# Patient Record
Sex: Male | Born: 1955 | Race: White | Hispanic: No | Marital: Married | Smoking: Never smoker
Health system: Southern US, Community
[De-identification: ages and names within clinical notes are randomized; demographics above are authoritative.]

## PROBLEM LIST (undated history)

## (undated) DIAGNOSIS — Z9889 Other specified postprocedural states: Secondary | ICD-10-CM

## (undated) DIAGNOSIS — M1990 Unspecified osteoarthritis, unspecified site: Secondary | ICD-10-CM

## (undated) DIAGNOSIS — I10 Essential (primary) hypertension: Secondary | ICD-10-CM

## (undated) DIAGNOSIS — D229 Melanocytic nevi, unspecified: Secondary | ICD-10-CM

## (undated) DIAGNOSIS — C4491 Basal cell carcinoma of skin, unspecified: Secondary | ICD-10-CM

## (undated) DIAGNOSIS — C801 Malignant (primary) neoplasm, unspecified: Secondary | ICD-10-CM

## (undated) DIAGNOSIS — R112 Nausea with vomiting, unspecified: Secondary | ICD-10-CM

## (undated) DIAGNOSIS — C439 Malignant melanoma of skin, unspecified: Secondary | ICD-10-CM

## (undated) HISTORY — DX: Melanocytic nevi, unspecified: D22.9

## (undated) HISTORY — DX: Basal cell carcinoma of skin, unspecified: C44.91

## (undated) HISTORY — PX: OTHER SURGICAL HISTORY: SHX169

## (undated) HISTORY — DX: Malignant melanoma of skin, unspecified: C43.9

---

## 2001-01-29 ENCOUNTER — Inpatient Hospital Stay (HOSPITAL_COMMUNITY): Admission: AD | Admit: 2001-01-29 | Discharge: 2001-01-31 | Payer: Self-pay | Admitting: Orthopedic Surgery

## 2007-10-11 DIAGNOSIS — C4491 Basal cell carcinoma of skin, unspecified: Secondary | ICD-10-CM

## 2007-10-11 DIAGNOSIS — D229 Melanocytic nevi, unspecified: Secondary | ICD-10-CM

## 2007-10-11 HISTORY — DX: Melanocytic nevi, unspecified: D22.9

## 2007-10-11 HISTORY — DX: Basal cell carcinoma of skin, unspecified: C44.91

## 2008-03-08 ENCOUNTER — Observation Stay (HOSPITAL_COMMUNITY): Admission: EM | Admit: 2008-03-08 | Discharge: 2008-03-09 | Payer: Self-pay | Admitting: Emergency Medicine
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
## 2011-04-02 LAB — CK TOTAL AND CKMB (NOT AT ARMC)
CK, MB: 1.2
CK, MB: 1.2
CK, MB: 1.4
CK, MB: 1.6
Relative Index: 0.8
Relative Index: 1.5
Relative Index: INVALID
Relative Index: INVALID
Total CK: 109
Total CK: 180
Total CK: 86
Total CK: 86

## 2011-04-02 LAB — CBC
HCT: 44.9
HCT: 46.3
Hemoglobin: 15.4
Hemoglobin: 16.2
MCHC: 34.2
MCHC: 35
MCV: 88.3
MCV: 89.1
Platelets: 193
Platelets: 202
RBC: 5.04
RBC: 5.24
RDW: 12.6
RDW: 12.6
WBC: 5
WBC: 5.7

## 2011-04-02 LAB — LIPID PANEL
Cholesterol: 169
HDL: 31 — ABNORMAL LOW
LDL Cholesterol: 106 — ABNORMAL HIGH
Total CHOL/HDL Ratio: 5.5
Triglycerides: 159 — ABNORMAL HIGH
VLDL: 32

## 2011-04-02 LAB — BASIC METABOLIC PANEL
BUN: 17
CO2: 27
Calcium: 9.5
Chloride: 109
Creatinine, Ser: 1.16
GFR calc Af Amer: >60
GFR calc non Af Amer: >60
Glucose, Bld: 99
Potassium: 4.6
Sodium: 142

## 2011-04-02 LAB — DIFFERENTIAL
Basophils Absolute: 0
Basophils Relative: 0
Eosinophils Absolute: 0
Eosinophils Relative: 1
Lymphocytes Relative: 35
Lymphs Abs: 1.8
Monocytes Absolute: 0.3
Monocytes Relative: 7
Neutro Abs: 2.9
Neutrophils Relative %: 57

## 2011-04-02 LAB — COMPREHENSIVE METABOLIC PANEL
ALT: 20
AST: 21
Albumin: 4
Alkaline Phosphatase: 38 — ABNORMAL LOW
BUN: 15
CO2: 27
Calcium: 9.3
Chloride: 108
Creatinine, Ser: 1.09
GFR calc Af Amer: >60
GFR calc non Af Amer: >60
Glucose, Bld: 107 — ABNORMAL HIGH
Potassium: 3.9
Sodium: 141
Total Bilirubin: 0.8
Total Protein: 6.5

## 2011-04-02 LAB — TROPONIN I
Troponin I: <0.01
Troponin I: <0.01
Troponin I: <0.01

## 2011-04-02 LAB — D-DIMER, QUANTITATIVE: D-Dimer, Quant: <0.22

## 2011-04-16 DIAGNOSIS — C439 Malignant melanoma of skin, unspecified: Secondary | ICD-10-CM

## 2011-04-16 HISTORY — DX: Malignant melanoma of skin, unspecified: C43.9

## 2011-07-01 HISTORY — PX: OTHER SURGICAL HISTORY: SHX169

## 2014-04-13 ENCOUNTER — Other Ambulatory Visit: Payer: Self-pay | Admitting: Orthopedic Surgery

## 2014-05-09 NOTE — H&P (Signed)
TOTAL KNEE ADMISSION H&P  Patient is being admitted for right total knee arthroplasty.  Subjective:  Chief Complaint:    Right knee primary OA / pain.  HPI: Lawrence Compton, 58 y.o. male, has a history of pain and functional disability in the right knee due to arthritis and has failed non-surgical conservative treatments for greater than 12 weeks to includeNSAID's and/or analgesics, corticosteriod injections, viscosupplementation injections, use of assistive devices and activity modification.  Onset of symptoms was gradual, starting  years ago with gradually worsening course since that time. The patient noted prior procedures on the knee to include  arthroscopy and ACL reconstruction on the right knee(s).  Patient currently rates pain in the right knee(s) at 9 out of 10 with activity. Patient has worsening of pain with activity and weight bearing, pain that interferes with activities of daily living, pain with passive range of motion, crepitus and joint swelling.  Patient has evidence of periarticular osteophytes and joint space narrowing by imaging studies.  There is no active infection.  Risks, benefits and expectations were discussed with the patient.  Risks including but not limited to the risk of anesthesia, blood clots, nerve damage, blood vessel damage, failure of the prosthesis, infection and up to and including death.  Patient understand the risks, benefits and expectations and wishes to proceed with surgery.   PCP: Gerrit Heck, MD  D/C Plans:      Home with HHPT  Post-op Meds:       No Rx given  Tranexamic Acid:      To be given - IV    Decadron:      Is to be given  FYI:     ASA post-op  Norco post-op   Past Medical History  Diagnosis Date  . PONV (postoperative nausea and vomiting)   . Hypertension   . Arthritis   . Cancer     melanoma right chest 2013     Past Surgical History  Procedure Laterality Date  . Colonscopy     . Acl implant      right knee       No Known Allergies    History  Substance Use Topics  . Smoking status: Never Smoker   . Smokeless tobacco: Never Used  . Alcohol Use: Yes     Comment: occas          Review of Systems  Constitutional: Negative.   HENT: Negative.   Eyes: Negative.   Respiratory: Negative.   Cardiovascular: Negative.   Gastrointestinal: Negative.   Genitourinary: Negative.   Musculoskeletal: Positive for joint pain.  Skin: Negative.   Neurological: Negative.   Endo/Heme/Allergies: Negative.   Psychiatric/Behavioral: Negative.     Objective:  Physical Exam  Constitutional: He is oriented to person, place, and time. He appears well-developed and well-nourished.  HENT:  Head: Normocephalic and atraumatic.  Eyes: Pupils are equal, round, and reactive to light.  Neck: Neck supple. No JVD present. No tracheal deviation present. No thyromegaly present.  Cardiovascular: Normal rate, regular rhythm, normal heart sounds and intact distal pulses.   Respiratory: Effort normal and breath sounds normal. No stridor. No respiratory distress. He has no wheezes.  GI: Soft. There is no tenderness. There is no guarding.  Musculoskeletal:       Right knee: He exhibits decreased range of motion, swelling, laceration (healed) and bony tenderness. He exhibits no ecchymosis, no deformity, no erythema and normal alignment. Tenderness found.  Lymphadenopathy:    He has no cervical  adenopathy.  Neurological: He is alert and oriented to person, place, and time.  Skin: Skin is warm and dry.  Psychiatric: He has a normal mood and affect.      Imaging Review Plain radiographs demonstrate severe degenerative joint disease of the right knee(s). The overall alignment is neutral. The bone quality appears to be good for age and reported activity level.  Assessment/Plan:  End stage arthritis, right knee   The patient history, physical examination, clinical judgment of the provider and imaging studies are  consistent with end stage degenerative joint disease of the right knee(s) and total knee arthroplasty is deemed medically necessary. The treatment options including medical management, injection therapy arthroscopy and arthroplasty were discussed at length. The risks and benefits of total knee arthroplasty were presented and reviewed. The risks due to aseptic loosening, infection, stiffness, patella tracking problems, thromboembolic complications and other imponderables were discussed. The patient acknowledged the explanation, agreed to proceed with the plan and consent was signed. Patient is being admitted for inpatient treatment for surgery, pain control, PT, OT, prophylactic antibiotics, VTE prophylaxis, progressive ambulation and ADL's and discharge planning. The patient is planning to be discharged home with home health services.     [place] Pugh Sueko Dimichele   PA-C  05/22/2014, 2:31 PM

## 2014-05-22 ENCOUNTER — Ambulatory Visit (HOSPITAL_COMMUNITY)
Admission: RE | Admit: 2014-05-22 | Discharge: 2014-05-22 | Disposition: A | Discharge status: Home / Self Care | Payer: BC Managed Care – PPO | Source: Ambulatory Visit | Attending: Anesthesiology | Admitting: Anesthesiology

## 2014-05-22 ENCOUNTER — Encounter (HOSPITAL_COMMUNITY)
Admission: RE | Admit: 2014-05-22 | Discharge: 2014-05-22 | Disposition: A | Discharge status: Home / Self Care | Payer: BC Managed Care – PPO | Source: Ambulatory Visit | Attending: Orthopedic Surgery | Admitting: Orthopedic Surgery

## 2014-05-22 ENCOUNTER — Encounter (HOSPITAL_COMMUNITY): Payer: Self-pay

## 2014-05-22 DIAGNOSIS — Z01818 Encounter for other preprocedural examination: Secondary | ICD-10-CM | POA: Diagnosis not present

## 2014-05-22 DIAGNOSIS — I10 Essential (primary) hypertension: Secondary | ICD-10-CM

## 2014-05-22 HISTORY — DX: Malignant (primary) neoplasm, unspecified: C80.1

## 2014-05-22 HISTORY — DX: Nausea with vomiting, unspecified: R11.2

## 2014-05-22 HISTORY — DX: Other specified postprocedural states: Z98.890

## 2014-05-22 HISTORY — DX: Essential (primary) hypertension: I10

## 2014-05-22 HISTORY — DX: Unspecified osteoarthritis, unspecified site: M19.90

## 2014-05-22 LAB — BASIC METABOLIC PANEL
Anion gap: 12 (ref 5–15)
BUN: 18 mg/dL (ref 6–23)
CO2: 26 mEq/L (ref 19–32)
Calcium: 10 mg/dL (ref 8.4–10.5)
Chloride: 101 mEq/L (ref 96–112)
Creatinine, Ser: 1.11 mg/dL (ref 0.50–1.35)
GFR calc Af Amer: 83 mL/min — ABNORMAL LOW (ref >90)
GFR calc non Af Amer: 71 mL/min — ABNORMAL LOW (ref >90)
Glucose, Bld: 91 mg/dL (ref 70–99)
Potassium: 4.1 mEq/L (ref 3.7–5.3)
Sodium: 139 mEq/L (ref 137–147)

## 2014-05-22 LAB — CBC
HCT: 44.7 % (ref 39.0–52.0)
Hemoglobin: 15.6 g/dL (ref 13.0–17.0)
MCH: 30.2 pg (ref 26.0–34.0)
MCHC: 34.9 g/dL (ref 30.0–36.0)
MCV: 86.5 fL (ref 78.0–100.0)
Platelets: 202 10*3/uL (ref 150–400)
RBC: 5.17 MIL/uL (ref 4.22–5.81)
RDW: 11.9 % (ref 11.5–15.5)
WBC: 7.4 10*3/uL (ref 4.0–10.5)

## 2014-05-22 LAB — URINALYSIS, ROUTINE W REFLEX MICROSCOPIC
Bilirubin Urine: NEGATIVE
Glucose, UA: NEGATIVE mg/dL
Hgb urine dipstick: NEGATIVE
Ketones, ur: NEGATIVE mg/dL
Leukocytes, UA: NEGATIVE
Nitrite: NEGATIVE
Protein, ur: NEGATIVE mg/dL
Specific Gravity, Urine: 1.014 (ref 1.005–1.030)
Urobilinogen, UA: 0.2 mg/dL (ref 0.0–1.0)
pH: 6 (ref 5.0–8.0)

## 2014-05-22 LAB — ABO/RH: ABO/RH(D): A NEG

## 2014-05-22 LAB — APTT: aPTT: 36 seconds (ref 24–37)

## 2014-05-22 LAB — PROTIME-INR
INR: 1.02 (ref 0.00–1.49)
Prothrombin Time: 13.5 seconds (ref 11.6–15.2)

## 2014-05-22 LAB — SURGICAL PCR SCREEN
MRSA, PCR: NEGATIVE
Staphylococcus aureus: POSITIVE — AB

## 2014-05-22 NOTE — Patient Instructions (Signed)
[place]  05/22/2014   Your procedure is scheduled on:     Monday May 29, 2014   Report to [facility] Main Entrance and follow signs to  [place] arrive at 10:20 AM.  Call this number if you have problems the morning of surgery [phone] or Presurgical Testing [phone].   Remember:  Do not eat food After Midnight but may take clear liquids till 7:20 am day of surgery then nothing by mouth.   For Living Will and/or Health Care Power Attorney Forms: please provide copy for your medical record, may bring AM of surgery (forms should be already notarized-we do not provide this service).   Take these medicines the morning of surgery with A SIP OF WATER: NONE                               You may not have any metal on your body including hair pins and piercings  Do not wear jewelry, lotions, powders, or deodorant.  Men may shave face and neck.               Do not bring valuables to the hospital. [place].  Contacts, dentures or bridgework may not be worn into surgery.  Leave suitcase in the car. After surgery it may be brought to your room.  For patients admitted to the hospital, checkout time is 11:00 AM the day of discharge.     Special Instructions: review fact sheets for MRSA information, Blood Transfusion fact sheet, Incentive Spirometry.  Remember: Type/Screen "Blue armsbands"- may not be removed once applied(would result in being retested AM of surgery, if removed). ________________________________________________________________________  [facility] - Preparing for Surgery Before surgery, you can play an important role.  Because skin is not sterile, your skin needs to be as free of germs as possible.  You can reduce the number of germs on your skin by washing with CHG (chlorahexidine gluconate) soap before surgery.  CHG is an antiseptic cleaner which kills germs and bonds with the skin to continue killing germs  even after washing. Please DO NOT use if you have an allergy to CHG or antibacterial soaps.  If your skin becomes reddened/irritated stop using the CHG and inform your nurse when you arrive at Short Stay. Do not shave (including legs and underarms) for at least 48 hours prior to the first CHG shower.  You may shave your face/neck. Please follow these instructions carefully:  1.  Shower with CHG Soap the night before surgery and the  morning of Surgery.  2.  If you choose to wash your hair, wash your hair first as usual with your  normal  shampoo.  3.  After you shampoo, rinse your hair and body thoroughly to remove the  shampoo.                           4.  Use CHG as you would any other liquid soap.  You can apply chg directly  to the skin and wash                       Gently with a scrungie or clean washcloth.  5.  Apply the CHG Soap to your body ONLY FROM THE NECK DOWN.   Do not use on face/ open  Wound or open sores. Avoid contact with eyes, ears mouth and genitals (private parts).                       Wash face,  Genitals (private parts) with your normal soap.             6.  Wash thoroughly, paying special attention to the area where your surgery  will be performed.  7.  Thoroughly rinse your body with warm water from the neck down.  8.  DO NOT shower/wash with your normal soap after using and rinsing off  the CHG Soap.                9.  Pat yourself dry with a clean towel.            10.  Wear clean pajamas.            11.  Place clean sheets on your bed the night of your first shower and do not  sleep with pets. Day of Surgery : Do not apply any lotions/deodorants the morning of surgery.  Please wear clean clothes to the hospital/surgery center.  FAILURE TO FOLLOW THESE INSTRUCTIONS MAY RESULT IN THE CANCELLATION OF YOUR SURGERY PATIENT SIGNATURE_________________________________  NURSE  SIGNATURE__________________________________  ________________________________________________________________________    CLEAR LIQUID DIET   Foods Allowed                                                                     Foods Excluded  Coffee and tea, regular and decaf                             liquids that you cannot  Plain Jell-O in any flavor                                             see through such as: Fruit ices (not with fruit pulp)                                     milk, soups, orange juice  Iced Popsicles                                    All solid food Carbonated beverages, regular and diet                                    Cranberry, grape and apple juices Sports drinks like Gatorade Lightly seasoned clear broth or consume(fat free) Sugar, honey syrup  Sample Menu Breakfast                                Lunch  Supper Cranberry juice                    Beef broth                            Chicken broth Jell-O                                     Grape juice                           Apple juice Coffee or tea                        Jell-O                                      Popsicle                                                Coffee or tea                        Coffee or tea  _____________________________________________________________________    Incentive Spirometer  An incentive spirometer is a tool that can help keep your lungs clear and active. This tool measures how well you are filling your lungs with each breath. Taking long deep breaths may help reverse or decrease the chance of developing breathing (pulmonary) problems (especially infection) following:  A long period of time when you are unable to move or be active. BEFORE THE PROCEDURE   If the spirometer includes an indicator to show your best effort, your nurse or respiratory therapist will set it to a desired goal.  If possible, sit up straight or lean  slightly forward. Try not to slouch.  Hold the incentive spirometer in an upright position. INSTRUCTIONS FOR USE   Sit on the edge of your bed if possible, or sit up as far as you can in bed or on a chair.  Hold the incentive spirometer in an upright position.  Breathe out normally.  Place the mouthpiece in your mouth and seal your lips tightly around it.  Breathe in slowly and as deeply as possible, raising the piston or the ball toward the top of the column.  Hold your breath for 3-5 seconds or for as long as possible. Allow the piston or ball to fall to the bottom of the column.  Remove the mouthpiece from your mouth and breathe out normally.  Rest for a few seconds and repeat Steps 1 through 7 at least 10 times every 1-2 hours when you are awake. Take your time and take a few normal breaths between deep breaths.  The spirometer may include an indicator to show your best effort. Use the indicator as a goal to work toward during each repetition.  After each set of 10 deep breaths, practice coughing to be sure your lungs are clear. If you have an incision (the cut made at the time of surgery), support your incision when coughing by placing a pillow or rolled up towels firmly against  it. Once you are able to get out of bed, walk around indoors and cough well. You may stop using the incentive spirometer when instructed by your caregiver.  RISKS AND COMPLICATIONS  Take your time so you do not get dizzy or light-headed.  If you are in pain, you may need to take or ask for pain medication before doing incentive spirometry. It is harder to take a deep breath if you are having pain. AFTER USE  Rest and breathe slowly and easily.  It can be helpful to keep track of a log of your progress. Your caregiver can provide you with a simple table to help with this. If you are using the spirometer at home, follow these instructions: [place] IF:   You are having difficultly using the  spirometer.  You have trouble using the spirometer as often as instructed.  Your pain medication is not giving enough relief while using the spirometer.  You develop fever of 100.5 F (38.1 C) or higher. SEEK IMMEDIATE MEDICAL CARE IF:   You cough up bloody sputum that had not been present before.  You develop fever of 102 F (38.9 C) or greater.  You develop worsening pain at or near the incision site. MAKE SURE YOU:   Understand these instructions.  Will watch your condition.  Will get help right away if you are not doing well or get worse. Document Released: 10/27/2006 Document Revised: 09/08/2011 Document Reviewed: 12/28/2006 ExitCare Patient Information 2014 Exit[employer], [place].   ________________________________________________________________________  WHAT IS A BLOOD TRANSFUSION? Blood Transfusion Information  A transfusion is the replacement of blood or some of its parts. Blood is made up of multiple cells which provide different functions.  Red blood cells carry oxygen and are used for blood loss replacement.  White blood cells fight against infection.  Platelets control bleeding.  Plasma helps clot blood.  Other blood products are available for specialized needs, such as hemophilia or other clotting disorders. BEFORE THE TRANSFUSION  Who gives blood for transfusions?   Healthy volunteers who are fully evaluated to make sure their blood is safe. This is blood bank blood. Transfusion therapy is the safest it has ever been in the practice of medicine. Before blood is taken from a donor, a complete history is taken to make sure that person has no history of diseases nor engages in risky social behavior (examples are intravenous drug use or sexual activity with multiple partners). The donor's travel history is screened to minimize risk of transmitting infections, such as malaria. The donated blood is tested for signs of infectious diseases, such as HIV and hepatitis.  The blood is then tested to be sure it is compatible with you in order to minimize the chance of a transfusion reaction. If you or a relative donates blood, this is often done in anticipation of surgery and is not appropriate for emergency situations. It takes many days to process the donated blood. RISKS AND COMPLICATIONS Although transfusion therapy is very safe and saves many lives, the main dangers of transfusion include:   Getting an infectious disease.  Developing a transfusion reaction. This is an allergic reaction to something in the blood you were given. Every precaution is taken to prevent this. The decision to have a blood transfusion has been considered carefully by your caregiver before blood is given. Blood is not given unless the benefits outweigh the risks. AFTER THE TRANSFUSION  Right after receiving a blood transfusion, you will usually feel much better and more  energetic. This is especially true if your red blood cells have gotten low (anemic). The transfusion raises the level of the red blood cells which carry oxygen, and this usually causes an energy increase.  The nurse administering the transfusion will monitor you carefully for complications. HOME CARE INSTRUCTIONS  No special instructions are needed after a transfusion. You may find your energy is better. Speak with your caregiver about any limitations on activity for underlying diseases you may have. SEEK MEDICAL CARE IF:   Your condition is not improving after your transfusion.  You develop redness or irritation at the intravenous (IV) site. SEEK IMMEDIATE MEDICAL CARE IF:  Any of the following symptoms occur over the next 12 hours:  Shaking chills.  You have a temperature by mouth above 102 F (38.9 C), not controlled by medicine.  Chest, back, or muscle pain.  People around you feel you are not acting correctly or are confused.  Shortness of breath or difficulty breathing.  Dizziness and fainting.  You  get a rash or develop hives.  You have a decrease in urine output.  Your urine turns a dark color or changes to pink, red, or brown. Any of the following symptoms occur over the next 10 days:  You have a temperature by mouth above 102 F (38.9 C), not controlled by medicine.  Shortness of breath.  Weakness after normal activity.  The white part of the eye turns yellow (jaundice).  You have a decrease in the amount of urine or are urinating less often.  Your urine turns a dark color or changes to pink, red, or brown. Document Released: 06/13/2000 Document Revised: 09/08/2011 Document Reviewed: 01/31/2008 [facility] Patient Information 2014 [place], [place].  _______________________________________________________________________

## 2014-05-22 NOTE — Progress Notes (Addendum)
EKG per epic from 03/16/2014  LOV note per chart per Dr Drema Dallas 03/16/2014 Called [facility]lick per Dr Aurea Graff office VM message left to fax clearance note per Dr Drema Dallas to PAT

## 2014-05-23 NOTE — Progress Notes (Signed)
Received clearance note from Dr Leighton Ruff on patient with note with clearance explaining EKG done 03/16/2014.  Placed on chart.

## 2014-05-29 ENCOUNTER — Inpatient Hospital Stay (HOSPITAL_COMMUNITY): Payer: BC Managed Care – PPO | Admitting: Anesthesiology

## 2014-05-29 ENCOUNTER — Encounter (HOSPITAL_COMMUNITY)
Admission: RE | Disposition: A | Discharge status: Home / Self Care | Payer: Self-pay | Source: Ambulatory Visit | Attending: Orthopedic Surgery

## 2014-05-29 ENCOUNTER — Encounter (HOSPITAL_COMMUNITY): Payer: Self-pay

## 2014-05-29 ENCOUNTER — Inpatient Hospital Stay (HOSPITAL_COMMUNITY)
Admission: RE | Admit: 2014-05-29 | Discharge: 2014-05-30 | DRG: 470 | Disposition: A | Discharge status: Home / Self Care | Payer: BC Managed Care – PPO | Source: Ambulatory Visit | Attending: Orthopedic Surgery | Admitting: Orthopedic Surgery

## 2014-05-29 DIAGNOSIS — Z85820 Personal history of malignant melanoma of skin: Secondary | ICD-10-CM

## 2014-05-29 DIAGNOSIS — Z96659 Presence of unspecified artificial knee joint: Secondary | ICD-10-CM

## 2014-05-29 DIAGNOSIS — I10 Essential (primary) hypertension: Secondary | ICD-10-CM | POA: Diagnosis present

## 2014-05-29 DIAGNOSIS — E669 Obesity, unspecified: Secondary | ICD-10-CM | POA: Diagnosis present

## 2014-05-29 DIAGNOSIS — Z6831 Body mass index (BMI) 31.0-31.9, adult: Secondary | ICD-10-CM

## 2014-05-29 DIAGNOSIS — M1711 Unilateral primary osteoarthritis, right knee: Secondary | ICD-10-CM | POA: Diagnosis present

## 2014-05-29 DIAGNOSIS — Z96651 Presence of right artificial knee joint: Secondary | ICD-10-CM

## 2014-05-29 HISTORY — PX: TOTAL KNEE ARTHROPLASTY: SHX125

## 2014-05-29 LAB — TYPE AND SCREEN
ABO/RH(D): A NEG
Antibody Screen: NEGATIVE

## 2014-05-29 SURGERY — ARTHROPLASTY, KNEE, TOTAL
Anesthesia: Spinal | Site: Knee | Laterality: Right

## 2014-05-29 MED ORDER — METOCLOPRAMIDE HCL 5 MG/ML IJ SOLN: 5.0000 mg | Freq: Three times a day (TID) | INTRAMUSCULAR | Status: DC | PRN

## 2014-05-29 MED ORDER — PHENOL 1.4 % MT LIQD
1.0000 | OROMUCOSAL | Status: DC | PRN
  Filled 2014-05-29: qty 177

## 2014-05-29 MED ORDER — PROPOFOL 10 MG/ML IV BOLUS
INTRAVENOUS | Status: AC
  Filled 2014-05-29: qty 20

## 2014-05-29 MED ORDER — ASPIRIN EC 325 MG PO TBEC
325.0000 mg | DELAYED_RELEASE_TABLET | Freq: Two times a day (BID) | ORAL | Status: DC
  Administered 2014-05-30: 325 mg via ORAL
  Filled 2014-05-29 (×3): qty 1

## 2014-05-29 MED ORDER — CEFAZOLIN SODIUM-DEXTROSE 2-3 GM-% IV SOLR
INTRAVENOUS | Status: AC
  Filled 2014-05-29: qty 50

## 2014-05-29 MED ORDER — CEFAZOLIN SODIUM-DEXTROSE 2-3 GM-% IV SOLR
2.0000 g | INTRAVENOUS | Status: AC
  Administered 2014-05-29: 2 g via INTRAVENOUS

## 2014-05-29 MED ORDER — MIDAZOLAM HCL 5 MG/5ML IJ SOLN
INTRAMUSCULAR | Status: DC | PRN
Start: 2014-05-29 — End: 2014-05-29
  Administered 2014-05-29: 2 mg via INTRAVENOUS

## 2014-05-29 MED ORDER — ONDANSETRON HCL 4 MG PO TABS: 4.0000 mg | ORAL_TABLET | Freq: Four times a day (QID) | ORAL | Status: DC | PRN

## 2014-05-29 MED ORDER — MENTHOL 3 MG MT LOZG
1.0000 | LOZENGE | OROMUCOSAL | Status: DC | PRN
  Filled 2014-05-29: qty 9

## 2014-05-29 MED ORDER — SODIUM CHLORIDE 0.9 % IJ SOLN
INTRAMUSCULAR | Status: AC
  Filled 2014-05-29: qty 10

## 2014-05-29 MED ORDER — SODIUM CHLORIDE 0.9 % IV SOLN
INTRAVENOUS | Status: DC
  Administered 2014-05-29: 18:00:00 via INTRAVENOUS
  Filled 2014-05-29 (×3): qty 1000

## 2014-05-29 MED ORDER — FENTANYL CITRATE 0.05 MG/ML IJ SOLN
INTRAMUSCULAR | Status: AC
  Filled 2014-05-29: qty 2

## 2014-05-29 MED ORDER — PROPOFOL INFUSION 10 MG/ML OPTIME
INTRAVENOUS | Status: DC | PRN
  Administered 2014-05-29: 100 ug/kg/min via INTRAVENOUS

## 2014-05-29 MED ORDER — DEXAMETHASONE SODIUM PHOSPHATE 10 MG/ML IJ SOLN
INTRAMUSCULAR | Status: AC
Start: 2014-05-29 — End: 2014-05-29
  Filled 2014-05-29: qty 1

## 2014-05-29 MED ORDER — DEXAMETHASONE SODIUM PHOSPHATE 10 MG/ML IJ SOLN
10.0000 mg | Freq: Once | INTRAMUSCULAR | Status: AC
  Administered 2014-05-29: 10 mg via INTRAVENOUS

## 2014-05-29 MED ORDER — CEFAZOLIN SODIUM-DEXTROSE 2-3 GM-% IV SOLR
2.0000 g | Freq: Four times a day (QID) | INTRAVENOUS | Status: AC
  Administered 2014-05-29 (×2): 2 g via INTRAVENOUS
  Filled 2014-05-29 (×2): qty 50

## 2014-05-29 MED ORDER — TRANEXAMIC ACID 100 MG/ML IV SOLN
1000.0000 mg | Freq: Once | INTRAVENOUS | Status: AC
  Administered 2014-05-29: 1000 mg via INTRAVENOUS
  Filled 2014-05-29: qty 10

## 2014-05-29 MED ORDER — DEXTROSE 5 % IV SOLN
500.0000 mg | Freq: Four times a day (QID) | INTRAVENOUS | Status: DC | PRN
  Administered 2014-05-29 (×2): 500 mg via INTRAVENOUS
  Filled 2014-05-29 (×4): qty 5

## 2014-05-29 MED ORDER — KETOROLAC TROMETHAMINE 30 MG/ML IJ SOLN
INTRAMUSCULAR | Status: DC | PRN
  Administered 2014-05-29: 30 mg via INTRAMUSCULAR

## 2014-05-29 MED ORDER — HYDROMORPHONE HCL 1 MG/ML IJ SOLN: 0.2500 mg | INTRAMUSCULAR | Status: DC | PRN

## 2014-05-29 MED ORDER — ONDANSETRON HCL 4 MG/2ML IJ SOLN
INTRAMUSCULAR | Status: DC | PRN
  Administered 2014-05-29: 4 mg via INTRAVENOUS

## 2014-05-29 MED ORDER — POVIDONE-IODINE 7.5 % EX SOLN: Freq: Once | CUTANEOUS | Status: DC

## 2014-05-29 MED ORDER — MAGNESIUM CITRATE PO SOLN: 1.0000 | Freq: Once | ORAL | Status: AC | PRN

## 2014-05-29 MED ORDER — FERROUS SULFATE 325 (65 FE) MG PO TABS
325.0000 mg | ORAL_TABLET | Freq: Three times a day (TID) | ORAL | Status: DC
  Administered 2014-05-30: 325 mg via ORAL
  Filled 2014-05-29 (×5): qty 1

## 2014-05-29 MED ORDER — MIDAZOLAM HCL 2 MG/2ML IJ SOLN
INTRAMUSCULAR | Status: AC
  Filled 2014-05-29: qty 2

## 2014-05-29 MED ORDER — LACTATED RINGERS IV SOLN
INTRAVENOUS | Status: DC
  Administered 2014-05-29: 1000 mL via INTRAVENOUS
  Administered 2014-05-29 (×2): via INTRAVENOUS

## 2014-05-29 MED ORDER — BUPIVACAINE HCL (PF) 0.75 % IJ SOLN
INTRAMUSCULAR | Status: DC | PRN
  Administered 2014-05-29: 2 mL

## 2014-05-29 MED ORDER — BUPIVACAINE-EPINEPHRINE (PF) 0.25% -1:200000 IJ SOLN
INTRAMUSCULAR | Status: AC
  Filled 2014-05-29: qty 30

## 2014-05-29 MED ORDER — ONDANSETRON HCL 4 MG/2ML IJ SOLN
INTRAMUSCULAR | Status: AC
  Filled 2014-05-29: qty 2

## 2014-05-29 MED ORDER — EPHEDRINE SULFATE 50 MG/ML IJ SOLN
INTRAMUSCULAR | Status: AC
  Filled 2014-05-29: qty 1

## 2014-05-29 MED ORDER — DOCUSATE SODIUM 100 MG PO CAPS
100.0000 mg | ORAL_CAPSULE | Freq: Two times a day (BID) | ORAL | Status: DC
  Administered 2014-05-29 – 2014-05-30 (×2): 100 mg via ORAL

## 2014-05-29 MED ORDER — BUPIVACAINE-EPINEPHRINE (PF) 0.25% -1:200000 IJ SOLN
INTRAMUSCULAR | Status: DC | PRN
  Administered 2014-05-29: 30 mL

## 2014-05-29 MED ORDER — HYDROMORPHONE HCL 1 MG/ML IJ SOLN
0.5000 mg | INTRAMUSCULAR | Status: DC | PRN
  Administered 2014-05-29 (×3): 1 mg via INTRAVENOUS
  Filled 2014-05-29 (×4): qty 1

## 2014-05-29 MED ORDER — 0.9 % SODIUM CHLORIDE (POUR BTL) OPTIME
TOPICAL | Status: DC | PRN
  Administered 2014-05-29: 1000 mL

## 2014-05-29 MED ORDER — FENTANYL CITRATE 0.05 MG/ML IJ SOLN
INTRAMUSCULAR | Status: DC | PRN
  Administered 2014-05-29: 100 ug via INTRAVENOUS

## 2014-05-29 MED ORDER — POLYETHYLENE GLYCOL 3350 17 G PO PACK
17.0000 g | PACK | Freq: Two times a day (BID) | ORAL | Status: DC
  Administered 2014-05-29 – 2014-05-30 (×2): 17 g via ORAL

## 2014-05-29 MED ORDER — ALUM & MAG HYDROXIDE-SIMETH 200-200-20 MG/5ML PO SUSP: 30.0000 mL | ORAL | Status: DC | PRN

## 2014-05-29 MED ORDER — CELECOXIB 200 MG PO CAPS
200.0000 mg | ORAL_CAPSULE | Freq: Two times a day (BID) | ORAL | Status: DC
  Administered 2014-05-29 – 2014-05-30 (×2): 200 mg via ORAL
  Filled 2014-05-29 (×3): qty 1

## 2014-05-29 MED ORDER — BISACODYL 10 MG RE SUPP
10.0000 mg | Freq: Every day | RECTAL | Status: DC | PRN
Start: 2014-05-29 — End: 2014-05-30

## 2014-05-29 MED ORDER — ONDANSETRON HCL 4 MG/2ML IJ SOLN: 4.0000 mg | Freq: Four times a day (QID) | INTRAMUSCULAR | Status: DC | PRN

## 2014-05-29 MED ORDER — SODIUM CHLORIDE 0.9 % IR SOLN
Status: DC | PRN
  Administered 2014-05-29: 3000 mL

## 2014-05-29 MED ORDER — METHOCARBAMOL 500 MG PO TABS
500.0000 mg | ORAL_TABLET | Freq: Four times a day (QID) | ORAL | Status: DC | PRN
Start: 2014-05-29 — End: 2014-05-30
  Administered 2014-05-30 (×2): 500 mg via ORAL
  Filled 2014-05-29 (×2): qty 1

## 2014-05-29 MED ORDER — CEFAZOLIN SODIUM-DEXTROSE 2-3 GM-% IV SOLR: 2.0000 g | INTRAVENOUS | Status: DC

## 2014-05-29 MED ORDER — SODIUM CHLORIDE 0.9 % IJ SOLN
INTRAMUSCULAR | Status: AC
  Filled 2014-05-29: qty 50

## 2014-05-29 MED ORDER — CHLORHEXIDINE GLUCONATE 4 % EX LIQD: 60.0000 mL | Freq: Once | CUTANEOUS | Status: DC

## 2014-05-29 MED ORDER — METOCLOPRAMIDE HCL 10 MG PO TABS: 5.0000 mg | ORAL_TABLET | Freq: Three times a day (TID) | ORAL | Status: DC | PRN

## 2014-05-29 MED ORDER — SODIUM CHLORIDE 0.9 % IJ SOLN
INTRAMUSCULAR | Status: DC | PRN
  Administered 2014-05-29: 30 mL

## 2014-05-29 MED ORDER — LACTATED RINGERS IV SOLN: INTRAVENOUS | Status: DC

## 2014-05-29 MED ORDER — DIPHENHYDRAMINE HCL 25 MG PO CAPS: 25.0000 mg | ORAL_CAPSULE | Freq: Four times a day (QID) | ORAL | Status: DC | PRN

## 2014-05-29 MED ORDER — KETOROLAC TROMETHAMINE 30 MG/ML IJ SOLN
INTRAMUSCULAR | Status: AC
  Filled 2014-05-29: qty 1

## 2014-05-29 MED ORDER — HYDROCODONE-ACETAMINOPHEN 7.5-325 MG PO TABS
1.0000 | ORAL_TABLET | ORAL | Status: DC
  Administered 2014-05-29 – 2014-05-30 (×6): 2 via ORAL
  Filled 2014-05-29 (×6): qty 2

## 2014-05-29 MED ORDER — DEXAMETHASONE SODIUM PHOSPHATE 10 MG/ML IJ SOLN
10.0000 mg | Freq: Once | INTRAMUSCULAR | Status: AC
  Administered 2014-05-30: 10 mg via INTRAVENOUS
  Filled 2014-05-29: qty 1

## 2014-05-29 SURGICAL SUPPLY — 56 items
ADH SKN CLS APL DERMABOND .7 (GAUZE/BANDAGES/DRESSINGS) ×1
BAG SPEC THK2 15X12 ZIP CLS (MISCELLANEOUS)
BAG ZIPLOCK 12X15 (MISCELLANEOUS) IMPLANT
BANDAGE ELASTIC 6 VELCRO ST LF (GAUZE/BANDAGES/DRESSINGS) ×2 IMPLANT
BANDAGE ESMARK 6X9 LF (GAUZE/BANDAGES/DRESSINGS) ×1 IMPLANT
BLADE SAW SGTL 13.0X1.19X90.0M (BLADE) ×2 IMPLANT
BNDG CMPR 9X6 STRL LF SNTH (GAUZE/BANDAGES/DRESSINGS) ×1
BNDG ESMARK 6X9 LF (GAUZE/BANDAGES/DRESSINGS) ×2
BOWL SMART MIX CTS (DISPOSABLE) ×2 IMPLANT
CAP KNEE ATTUNE RP ×1 IMPLANT
CEMENT HV SMART SET (Cement) ×2 IMPLANT
CUFF TOURN SGL QUICK 34 (TOURNIQUET CUFF) ×2
CUFF TRNQT CYL 34X4X40X1 (TOURNIQUET CUFF) ×1 IMPLANT
DECANTER SPIKE VIAL GLASS SM (MISCELLANEOUS) ×2 IMPLANT
DERMABOND ADVANCED (GAUZE/BANDAGES/DRESSINGS) ×1
DERMABOND ADVANCED .7 DNX12 (GAUZE/BANDAGES/DRESSINGS) IMPLANT
DRAPE EXTREMITY T 121X128X90 (DRAPE) ×2 IMPLANT
DRAPE POUCH INSTRU U-SHP 10X18 (DRAPES) ×2 IMPLANT
DRAPE U-SHAPE 47X51 STRL (DRAPES) ×2 IMPLANT
DRSG AQUACEL AG ADV 3.5X10 (GAUZE/BANDAGES/DRESSINGS) ×2 IMPLANT
DURAPREP 26ML APPLICATOR (WOUND CARE) ×4 IMPLANT
ELECT REM PT RETURN 9FT ADLT (ELECTROSURGICAL) ×2
ELECTRODE REM PT RTRN 9FT ADLT (ELECTROSURGICAL) ×1 IMPLANT
FACESHIELD WRAPAROUND (MASK) ×8 IMPLANT
FACESHIELD WRAPAROUND OR TEAM (MASK) ×5 IMPLANT
GLOVE BIOGEL PI IND STRL 7.5 (GLOVE) ×1 IMPLANT
GLOVE BIOGEL PI IND STRL 8.5 (GLOVE) ×1 IMPLANT
GLOVE BIOGEL PI INDICATOR 7.5 (GLOVE) ×1
GLOVE BIOGEL PI INDICATOR 8.5 (GLOVE) ×1
GLOVE ECLIPSE 8.0 STRL XLNG CF (GLOVE) ×2 IMPLANT
GLOVE ORTHO TXT STRL SZ7.5 (GLOVE) ×4 IMPLANT
GOWN SPEC L3 XXLG W/TWL (GOWN DISPOSABLE) ×2 IMPLANT
GOWN STRL REUS W/TWL LRG LVL3 (GOWN DISPOSABLE) ×2 IMPLANT
HANDPIECE INTERPULSE COAX TIP (DISPOSABLE) ×2
KIT BASIN OR (CUSTOM PROCEDURE TRAY) ×2 IMPLANT
LIQUID BAND (GAUZE/BANDAGES/DRESSINGS) ×2 IMPLANT
MANIFOLD NEPTUNE II (INSTRUMENTS) ×2 IMPLANT
NDL SAFETY ECLIPSE 18X1.5 (NEEDLE) ×1 IMPLANT
NEEDLE HYPO 18GX1.5 SHARP (NEEDLE) ×2
PACK TOTAL JOINT (CUSTOM PROCEDURE TRAY) ×2 IMPLANT
POSITIONER SURGICAL ARM (MISCELLANEOUS) ×2 IMPLANT
SET HNDPC FAN SPRY TIP SCT (DISPOSABLE) ×1 IMPLANT
SET PAD KNEE POSITIONER (MISCELLANEOUS) ×2 IMPLANT
SUCTION FRAZIER 12FR DISP (SUCTIONS) ×2 IMPLANT
SUT MNCRL AB 4-0 PS2 18 (SUTURE) ×2 IMPLANT
SUT VIC AB 1 CT1 36 (SUTURE) ×2 IMPLANT
SUT VIC AB 2-0 CT1 27 (SUTURE) ×6
SUT VIC AB 2-0 CT1 TAPERPNT 27 (SUTURE) ×3 IMPLANT
SUT VLOC 180 0 24IN GS25 (SUTURE) ×2 IMPLANT
SYR 50ML LL SCALE MARK (SYRINGE) ×2 IMPLANT
TOWEL OR 17X26 10 PK STRL BLUE (TOWEL DISPOSABLE) ×2 IMPLANT
TOWEL OR NON WOVEN STRL DISP B (DISPOSABLE) ×1 IMPLANT
TRAY FOLEY BAG SILVER LF 16FR (CATHETERS) ×1 IMPLANT
TRAY FOLEY CATH 14FRSI W/METER (CATHETERS) ×1 IMPLANT
WATER STERILE IRR 1500ML POUR (IV SOLUTION) ×2 IMPLANT
WRAP KNEE MAXI GEL POST OP (GAUZE/BANDAGES/DRESSINGS) ×2 IMPLANT

## 2014-05-29 NOTE — Transfer of Care (Signed)
Immediate Anesthesia Transfer of Care Note  Patient: Lawrence Compton  Procedure(s) Performed: Procedure(s): RIGHT TOTAL KNEE ARTHROPLASTY WITH REMOVAL OF HARDWARE (Right)  Patient Location: PACU  Anesthesia Type:Spinal  Level of Consciousness: awake and alert   Airway & Oxygen Therapy: Patient Spontanous Breathing and Patient connected to face mask oxygen  Post-op Assessment: Report given to PACU RN, Post -op Vital signs reviewed and stable and Patient moving all extremities  Post vital signs: Reviewed and stable  Complications: No apparent anesthesia complications

## 2014-05-29 NOTE — Plan of Care (Signed)
Problem: Phase I Progression Outcomes Goal: CMS/Neurovascular status WDL Outcome: Completed/Met Date Met:  05/29/14

## 2014-05-29 NOTE — Anesthesia Procedure Notes (Signed)
Spinal Patient location during procedure: OR Start time: 05/29/2014 1:03 PM End time: 05/29/2014 1:05 PM Staffing Resident/CRNA: Harle Stanford R Performed by: resident/CRNA  Preanesthetic Checklist Completed: patient identified, site marked, surgical consent, pre-op evaluation, timeout performed, IV checked, risks and benefits discussed and monitors and equipment checked Spinal Block Patient position: sitting Prep: Betadine Patient monitoring: heart rate Approach: midline Location: L3-4 Injection technique: single-shot Needle Needle type: Whitacre  Needle gauge: 25 G Needle length: 9 cm Needle insertion depth: 8 cm Assessment Sensory level: T6

## 2014-05-29 NOTE — Interval H&P Note (Signed)
History and Physical Interval Note:  05/29/2014 12:07 PM  Lawrence Compton  has presented today for surgery, with the diagnosis of right knee osteoarthritis and retained hardware  The various methods of treatment have been discussed with the patient and family. After consideration of risks, benefits and other options for treatment, the patient has consented to  Procedure(s): RIGHT TOTAL KNEE ARTHROPLASTY WITH POSSIBLE REMOVAL OF HARDWARE (Right) as a surgical intervention .  The patient's history has been reviewed, patient examined, no change in status, stable for surgery.  I have reviewed the patient's chart and labs.  Questions were answered to the patient's satisfaction.     Mauri Pole

## 2014-05-29 NOTE — Op Note (Signed)
NAME:  Lawrence Compton                      MEDICAL RECORD NO.:  [id number]                             FACILITY:  [facility]      PHYSICIAN:  Pietro Cassis. Alvan Dame, M.D.  DATE OF BIRTH:  1956/05/04      DATE OF PROCEDURE:  05/29/2014                                     OPERATIVE REPORT         PREOPERATIVE DIAGNOSIS:  Right knee osteoarthritis.      POSTOPERATIVE DIAGNOSIS:  Right knee osteoarthritis.  Retained Orthopaedic hardware     FINDINGS:  The patient was noted to have complete loss of cartilage and   bone-on-bone arthritis with associated osteophytes in all three compartments of   the knee with a significant synovitis and associated effusion.      PROCEDURE: 1.  Right total knee replacement. 2. Removal of deep implant     COMPONENTS USED:  DePuy Attune rotating platform posterior stabilized knee   system, a size 6 standard femur, 7 tibia, 10 mm AOX PS insert, and 53mm AOX anatomic patellar   button.      SURGEON:  Pietro Cassis. Alvan Dame, M.D.      ASSISTANT:  Danae Orleans, PA-C.      ANESTHESIA:  Spinal.      SPECIMENS:  None.      COMPLICATION:  None.      DRAINS:  None.  EBL: <100cc      TOURNIQUET TIME:   Total Tourniquet Time Documented: Thigh (Right) - 45 minutes Total: Thigh (Right) - 45 minutes  .      The patient was stable to the recovery room.      INDICATION FOR PROCEDURE:  Lawrence Compton is a 58 y.o. male patient of   mine.  The patient had been seen, evaluated, and treated conservatively in the   office with medication, activity modification, and injections.  The patient had   radiographic changes of bone-on-bone arthritis with endplate sclerosis and osteophytes noted.  He has a history of previous ACL reconstruction with retained metal implants.  It was discussed that one or both of the screws may need to be removed at the time of his TKR.      The patient failed conservative measures including medication, injections, and activity modification, and at this  point was ready for more definitive measures.   Based on the radiographic changes and failed conservative measures, the patient   decided to proceed with total knee replacement.  Risks of infection,   DVT, component failure, need for revision surgery, postop course, and   expectations were all   discussed and reviewed.  Consent was obtained for benefit of pain   relief.      PROCEDURE IN DETAIL:  The patient was brought to the operative theater.   Once adequate anesthesia, preoperative antibiotics, 2 gm of Ancef administered, the patient was positioned supine with the right thigh tourniquet placed.  The  right lower extremity was prepped and draped in sterile fashion.  A time-   out was performed identifying the patient, planned procedure, and   extremity.      The  right lower extremity was placed in the [employer] leg holder.  The leg was   exsanguinated, tourniquet elevated to 250 mmHg.  A midline incision was   made followed by median parapatellar arthrotomy.  Following initial   exposure, attention was first directed to the patella.  Precut   measurement was noted to be 26 mm.  I resected down to 14-15 mm and used a   41 anatomic patellar button to restore patellar height as well as cover the cut   surface.      The lug holes were drilled and a metal shim was placed to protect the   patella from retractors and saw blades.      At this point, attention was now directed to the femur.  The femoral   canal was opened with a drill, irrigated to try to prevent fat emboli.  An   intramedullary rod was passed at 5 degrees valgus, 9 mm of bone was   resected off the distal femur.  Following this resection, the tibia was   subluxated anteriorly.  Using the extramedullary guide, 4 mm of bone was resected off   the proximal lateral tibia.  We confirmed the gap would be   stable medially and laterally with a size 85mm insert as well as confirmed   the cut was perpendicular in the coronal plane,  checking with an alignment rod.      Once this was done, I sized the femur to be a size 6 in the anterior-   posterior dimension, chose a standard component based on medial and   lateral dimension.  The size 6 rotation block was then pinned in   position anterior referenced using the tensioning device to set rotation and match extension and flexion gaps.  The   anterior, posterior, and  chamfer cuts were made without difficulty nor   notching making certain that I was along the anterior cortex to help   with flexion gap stability.      The final box cut was made off the lateral aspect of distal femur.      At this point, the tibia was sized to be a size 7, the size 7 tray was   then pinned in position through the medial third of the tubercle  As expected during preparation of the tibia we encountered the tibial based ACL screw.  It needed to be removed.  Using an osteotome I created a small opening in the proximal anterior medial tibial to expose the distal end of the screw.  The screw once identified was removed without complication.  Once removed I was bale to complete tibial preparation by   drilling, and then keel punching the tibia.  Trial reduction was now carried with a 6 femur,  7 tibia, a 8 then 10 mm insert, and the 41 anatomic patella botton.  The knee was brought to   extension, full extension with good flexion stability with the patella   tracking through the trochlea without application of pressure.  Given   all these findings, the trial components removed.  Final components were   opened and cement was mixed.  The knee was irrigated with normal saline   solution and pulse lavage.  The synovial lining was   then injected with 30cc of 0.25% Marcaine with epinephrine and 1 cc of Toradol plus 30cc of NS for a    total of 61 cc.      The knee was irrigated.  Final implants  were then cemented onto clean and   dried cut surfaces of bone with the knee brought to extension with a 10 mm  trial insert.      Once the cement had fully cured, the excess cement was removed   throughout the knee.  I confirmed I was satisfied with the range of   motion and stability, and the final 10 mm PS AOX insert was chosen.  It was   placed into the knee.      The tourniquet had been let down at 45 minutes.  No significant   hemostasis required.  The   extensor mechanism was then reapproximated using #1 Vicryl and #0 V-lock sutures with the knee   in flexion.  The   remaining wound was closed with 2-0 Vicryl and running 4-0 Monocryl.   The knee was cleaned, dried, dressed sterilely using Dermabond and   Aquacel dressing.  The patient was then   brought to recovery room in stable condition, tolerating the procedure   well.   Please note that Physician Assistant, Danae Orleans, PA-C, was present for the entirety of the case, and was utilized for pre-operative positioning, peri-operative retractor management, general facilitation of the procedure.  He was also utilized for primary wound closure at the end of the case.              Pietro Cassis Alvan Dame, M.D.    05/29/2014 2:44 PM

## 2014-05-29 NOTE — Anesthesia Postprocedure Evaluation (Signed)
  Anesthesia Post-op Note  Patient: Chiropractor  Procedure(s) Performed: Procedure(s) (LRB): RIGHT TOTAL KNEE ARTHROPLASTY WITH REMOVAL OF HARDWARE (Right)  Patient Location: PACU  Anesthesia Type: Spinal  Level of Consciousness: awake and alert   Airway and Oxygen Therapy: Patient Spontanous Breathing  Post-op Pain: mild  Post-op Assessment: Post-op Vital signs reviewed, Patient's Cardiovascular Status Stable, Respiratory Function Stable, Patent Airway and No signs of Nausea or vomiting  Last Vitals:  Filed Vitals:   05/29/14 1749  BP: 128/75  Pulse: 53  Temp: 36.4 C  Resp: 15    Post-op Vital Signs: stable   Complications: No apparent anesthesia complications

## 2014-05-29 NOTE — Anesthesia Preprocedure Evaluation (Addendum)
Anesthesia Evaluation  Patient identified by MRN, date of birth, ID band Patient awake    Reviewed: Allergy & Precautions, H&P , NPO status , Patient's Chart, lab work & pertinent test results  History of Anesthesia Complications (+) PONV  Airway Mallampati: II  TM Distance: >3 FB Neck ROM: full    Dental no notable dental hx. (+) Teeth Intact, Dental Advisory Given   Pulmonary neg pulmonary ROS,  breath sounds clear to auscultation  Pulmonary exam normal       Cardiovascular Exercise Tolerance: Good hypertension, Pt. on medications Rhythm:regular Rate:Normal     Neuro/Psych negative neurological ROS  negative psych ROS   GI/Hepatic negative GI ROS, Neg liver ROS,   Endo/Other  negative endocrine ROS  Renal/GU negative Renal ROS  negative genitourinary   Musculoskeletal   Abdominal   Peds  Hematology negative hematology ROS (+)   Anesthesia Other Findings   Reproductive/Obstetrics negative OB ROS                            Anesthesia Physical Anesthesia Plan  ASA: II  Anesthesia Plan: Spinal   Post-op Pain Management:    Induction:   Airway Management Planned: Simple Face Mask  Additional Equipment:   Intra-op Plan:   Post-operative Plan:   Informed Consent: I have reviewed the patients History and Physical, chart, labs and discussed the procedure including the risks, benefits and alternatives for the proposed anesthesia with the patient or authorized representative who has indicated his/her understanding and acceptance.   Dental Advisory Given  Plan Discussed with: CRNA and Surgeon  Anesthesia Plan Comments:         Anesthesia Quick Evaluation

## 2014-05-29 NOTE — Plan of Care (Signed)
Problem: Phase I Progression Outcomes Goal: Hemodynamically stable Outcome: Completed/Met Date Met:  05/29/14

## 2014-05-30 ENCOUNTER — Encounter (HOSPITAL_COMMUNITY): Payer: Self-pay | Admitting: Orthopedic Surgery

## 2014-05-30 DIAGNOSIS — E669 Obesity, unspecified: Secondary | ICD-10-CM | POA: Diagnosis present

## 2014-05-30 LAB — CBC
HCT: 37.5 % — ABNORMAL LOW (ref 39.0–52.0)
Hemoglobin: 13.5 g/dL (ref 13.0–17.0)
MCH: 30.8 pg (ref 26.0–34.0)
MCHC: 36 g/dL (ref 30.0–36.0)
MCV: 85.6 fL (ref 78.0–100.0)
Platelets: 203 10*3/uL (ref 150–400)
RBC: 4.38 MIL/uL (ref 4.22–5.81)
RDW: 12 % (ref 11.5–15.5)
WBC: 14.2 10*3/uL — ABNORMAL HIGH (ref 4.0–10.5)

## 2014-05-30 LAB — BASIC METABOLIC PANEL
Anion gap: 12 (ref 5–15)
BUN: 14 mg/dL (ref 6–23)
CO2: 25 mEq/L (ref 19–32)
Calcium: 8.8 mg/dL (ref 8.4–10.5)
Chloride: 104 mEq/L (ref 96–112)
Creatinine, Ser: 1.03 mg/dL (ref 0.50–1.35)
GFR calc Af Amer: >90 mL/min (ref >90)
GFR calc non Af Amer: 78 mL/min — ABNORMAL LOW (ref >90)
Glucose, Bld: 132 mg/dL — ABNORMAL HIGH (ref 70–99)
Potassium: 4.5 mEq/L (ref 3.7–5.3)
Sodium: 141 mEq/L (ref 137–147)

## 2014-05-30 MED ORDER — POLYETHYLENE GLYCOL 3350 17 G PO PACK: 17.0000 g | PACK | Freq: Two times a day (BID) | ORAL | Status: DC

## 2014-05-30 MED ORDER — DSS 100 MG PO CAPS: 100.0000 mg | ORAL_CAPSULE | Freq: Two times a day (BID) | ORAL | Status: DC

## 2014-05-30 MED ORDER — METHOCARBAMOL 500 MG PO TABS: 500.0000 mg | ORAL_TABLET | Freq: Four times a day (QID) | ORAL | Status: DC | PRN

## 2014-05-30 MED ORDER — FERROUS SULFATE 325 (65 FE) MG PO TABS: 325.0000 mg | ORAL_TABLET | Freq: Three times a day (TID) | ORAL | Status: DC

## 2014-05-30 MED ORDER — ASPIRIN 325 MG PO TBEC: 325.0000 mg | DELAYED_RELEASE_TABLET | Freq: Two times a day (BID) | ORAL | Status: AC

## 2014-05-30 MED ORDER — HYDROCODONE-ACETAMINOPHEN 7.5-325 MG PO TABS: 1.0000 | ORAL_TABLET | ORAL | Status: DC | PRN

## 2014-05-30 NOTE — Care Management Note (Addendum)
    Page 1 of 2   05/30/2014     12:09:17 PM CARE MANAGEMENT NOTE 05/30/2014  Patient:  Lawrence Compton, Lawrence Compton   Account Number:  [id number]  Date Initiated:  05/30/2014  Documentation initiated by:  [facility]  Subjective/Objective Assessment:   adm: RIGHT TOTAL KNEE ARTHROPLASTY     Action/Plan:   discharge planning   Anticipated DC Date:  05/30/2014   Anticipated DC Plan:  [place]  CM consult      [facility] Choice  HOME HEALTH   Choice offered to / List presented to:  C-1 Patient   DME arranged  NA      DME agency  NA     [facility] arranged  HH-2 PT      [place]   Status of service:  Completed, signed off Medicare Important Message given?   (If response is "NO", the following Medicare IM given date fields will be blank) Date Medicare IM given:   Medicare IM given by:   Date Additional Medicare IM given:   Additional Medicare IM given by:    Discharge Disposition:  [place]  Per UR Regulation:    If discussed at Long Length of Stay Meetings, dates discussed:    Comments:  05/30/14 12:05 CM met with pt in room AGAIN as pt's wife states pt has changed his mind and NEEDS HHPT.  Pt chooses [facility] to render HHPT.  Address and contact information verified with pt.  Referral called to Shaune Leeks. No other CM needs were commiunicated.  Mariane Masters, BSN, Jearld Lesch [phone]. 05/30/14 10:30 CM met with pt to offer choice of home health agency.  Pt states he has been through this surgery before and politely declines any HH services.  Pt has DME at home and wi going home with wife.  No other CM needs were communicated.  Mariane Masters, BSN, [place].

## 2014-05-30 NOTE — Evaluation (Signed)
Occupational Therapy Evaluation and Discharge Summary Patient Details Name: Lawrence Compton MRN: [id number] DOB: March 30, 1956 Today's Date: 05/30/2014    History of Present Illness s/p R TKA;  PMHx: ACL reconstruction R knee   Clinical Impression   Pt admitted with the above diagnosis and overall is doing very well with adls.  Only needs assist donning R sock and shoe.  Pt will have wife home to assist with this.  No further OT needs.    Follow Up Recommendations  No OT follow up    Equipment Recommendations  None recommended by OT    Recommendations for Other Services       Precautions / Restrictions Precautions Precautions: Fall Restrictions Weight Bearing Restrictions: No Other Position/Activity Restrictions: WBAT      Mobility Bed Mobility Overal bed mobility: Needs Assistance Bed Mobility: Supine to Sit     Supine to sit: Supervision     General bed mobility comments: cues for technique  Transfers Overall transfer level: Needs assistance Equipment used: Rolling walker (2 wheeled) Transfers: Sit to/from Stand Sit to Stand: Supervision         General transfer comment: cues for hand placement     Balance Overall balance assessment: No apparent balance deficits (not formally assessed)                                          ADL Overall ADL's : Needs assistance/impaired Eating/Feeding: Independent   Grooming: Modified independent;Standing   Upper Body Bathing: Set up;Sitting   Lower Body Bathing: Set up;Sit to/from stand   Upper Body Dressing : Set up;Sitting   Lower Body Dressing: Minimal assistance;Sit to/from stand Lower Body Dressing Details (indicate cue type and reason): min assist donning R sock and shoe only Toilet Transfer: Modified Independent;Comfort height toilet   Toileting- Clothing Manipulation and Hygiene: Modified independent;Sit to/from stand       Functional mobility during ADLs: Modified independent General  ADL Comments: Pt doing great with adls.  pt has had previous knee surgeries in past and does well with adl techniques.     Vision                     Perception     Praxis      Pertinent Vitals/Pain Pain Assessment: 0-10 Pain Score: 4  Pain Location: R TKA Pain Descriptors / Indicators: Aching Pain Intervention(s): Monitored during session;Premedicated before session;Repositioned;Ice applied     Hand Dominance Right   Extremity/Trunk Assessment Upper Extremity Assessment Upper Extremity Assessment: Overall WFL for tasks assessed   Lower Extremity Assessment Lower Extremity Assessment: Defer to PT evaluation RLE Deficits / Details: knee extension and hip flexion grossly 3/5; ankle WFL   Cervical / Trunk Assessment Cervical / Trunk Assessment: Normal   Communication Communication Communication: No difficulties   Cognition Arousal/Alertness: Awake/alert Behavior During Therapy: WFL for tasks assessed/performed Overall Cognitive Status: Within Functional Limits for tasks assessed                     General Comments       Exercises       Shoulder Instructions      Home Living Family/patient expects to be discharged to:: Private residence Living Arrangements: Spouse/significant other Available Help at Discharge: Family;Available 24 hours/day Type of Home: House Home Access: Stairs to enter [employer] of Steps: 7 Entrance Stairs-Rails: Right  Home Layout: Two level;Able to live on main level with bedroom/bathroom Alternate Level Stairs-Number of Steps: 12+; plans one trip up per day to shower   Bathroom Shower/Tub: Walk-in shower;Door   [employer] Toilet: Standard     Home Equipment: Kasandra Knudsen - single point;Walker - 2 wheels;Bedside commode;Shower seat   Additional Comments: plans  to borrow shower seat      Prior Functioning/Environment Level of Independence: Independent        Comments: enjoys backpacking    OT Diagnosis:      OT Problem List:     OT Treatment/Interventions:      OT Goals(Current goals can be found in the care plan section) Acute Rehab OT Goals Patient Stated Goal: home, back to I OT Goal Formulation: All assessment and education complete, DC therapy  OT Frequency:     Barriers to D/C:            Co-evaluation              End of Session Equipment Utilized During Treatment: Rolling walker Nurse Communication: Mobility status  Activity Tolerance: Patient tolerated treatment well Patient left: in chair;with call bell/phone within reach   Time: 0900-0920 OT Time Calculation (min): 20 min Charges:  OT General Charges $OT Visit: 1 Procedure OT Evaluation $Initial OT Evaluation Tier I: 1 Procedure OT Treatments $Self Care/Home Management : 8-22 mins G-Codes:    Glenford Peers 05-31-2014, 9:28 AM  [phone]

## 2014-05-30 NOTE — Evaluation (Signed)
Physical Therapy Evaluation Patient Details Name: Lawrence Compton MRN: [id number] DOB: Nov 28, 1955 Today's Date: 05/30/2014   History of Present Illness  s/p R TKA;  PMHx: ACL reconstruction R knee  Clinical Impression  Pt will benefit from PT to address deficits below; will plan for stair training and further ex later today    Follow Up Recommendations Home health PT;Outpatient PT (vs)    Equipment Recommendations  None recommended by PT    Recommendations for Other Services       Precautions / Restrictions Precautions Precautions: Fall Restrictions Weight Bearing Restrictions: No Other Position/Activity Restrictions: WBAT      Mobility  Bed Mobility Overal bed mobility: Needs Assistance Bed Mobility: Supine to Sit     Supine to sit: Supervision     General bed mobility comments: cues for technique  Transfers Overall transfer level: Needs assistance Equipment used: Rolling walker (2 wheeled) Transfers: Sit to/from Stand Sit to Stand: Min guard         General transfer comment: cues for hand placement   Ambulation/Gait Ambulation/Gait assistance: Min guard;Supervision Ambulation Distance (Feet): 60 Feet Assistive device: Rolling walker (2 wheeled) Gait Pattern/deviations: Step-to pattern;Step-through pattern     General Gait Details: cues for step length, RW safety  Stairs            Wheelchair Mobility    Modified Rankin (Stroke Patients Only)       Balance                                             Pertinent Vitals/Pain Pain Assessment: 0-10 Pain Score: 4  Pain Location: R TKA Pain Descriptors / Indicators: Aching Pain Intervention(s): Limited activity within patient's tolerance;Monitored during session;Premedicated before session;Ice applied    Home Living Family/patient expects to be discharged to:: Private residence Living Arrangements: Spouse/significant other   Type of Home: House Home Access: Stairs to  enter Entrance Stairs-Rails: Right Entrance Stairs-Number of Steps: 7 Home Layout: Two level;Able to live on main level with bedroom/bathroom (1/2 bath downstairs) Home Equipment: Cane - single point;Walker - 2 wheels;Bedside commode Additional Comments: plans  to borrow shower seat    Prior Function Level of Independence: Independent               Hand Dominance        Extremity/Trunk Assessment               Lower Extremity Assessment: RLE deficits/detail RLE Deficits / Details: knee extension and hip flexion grossly 3/5; ankle WFL    Cervical / Trunk Assessment: Normal  Communication   Communication: No difficulties  Cognition Arousal/Alertness: Awake/alert Behavior During Therapy: WFL for tasks assessed/performed Overall Cognitive Status: Within Functional Limits for tasks assessed                      General Comments      Exercises Total Joint Exercises Ankle Circles/Pumps: AROM;Both;5 reps Quad Sets: AROM;10 reps;Right Knee Flexion: AROM;AAROM;Right;5 reps Goniometric ROM: grossly -5* to  65*        Assessment/Plan    PT Assessment Patient needs continued PT services  PT Diagnosis Difficulty walking   PT Problem List    PT Treatment Interventions DME instruction;Gait training;Functional mobility training;Therapeutic activities;Therapeutic exercise;Patient/family education;Stair training   PT Goals (Current goals can be found in the Care Plan section) Acute Rehab PT Goals  Patient Stated Goal: home, back to I PT Goal Formulation: With patient Time For Goal Achievement: Jun 20, 2014 Potential to Achieve Goals: Good    Frequency     Barriers to discharge        Co-evaluation               End of Session   Activity Tolerance: Patient tolerated treatment well Patient left: in chair;with call bell/phone within reach           Time: 0829-0857 PT Time Calculation (min) (ACUTE ONLY): 28 min   Charges:   PT  Evaluation $Initial PT Evaluation Tier I: 1 Procedure PT Treatments $Gait Training: 8-22 mins $Therapeutic Exercise: 8-22 mins   PT G Codes:          Lawrence Compton 20-Jun-2014, 9:04 AM

## 2014-05-30 NOTE — Progress Notes (Signed)
Utilization review completed.  

## 2014-05-30 NOTE — Progress Notes (Signed)
   05/30/14 1200  PT Visit Information  Last PT Received On 05/30/14  Assistance Needed +1  History of Present Illness s/p R TKA;  PMHx: ACL reconstruction R knee  PT Time Calculation  PT Start Time (ACUTE ONLY) 1155  PT Stop Time (ACUTE ONLY) 1230  PT Time Calculation (min) (ACUTE ONLY) 35 min  Subjective Data  Patient Stated Goal home, back to I  Precautions  Precautions Fall  Restrictions  Other Position/Activity Restrictions WBAT  Pain Assessment  Pain Assessment 0-10  Pain Score 3  Pain Location R thigh  Pain Descriptors / Indicators Sore  Pain Intervention(s) Monitored during session;Ice applied  Cognition  Arousal/Alertness Awake/alert  Behavior During Therapy WFL for tasks assessed/performed  Overall Cognitive Status Within Functional Limits for tasks assessed  Bed Mobility  Overal bed mobility Modified Independent  Bed Mobility Supine to Sit;Sit to Supine  Supine to sit Modified independent (Device/Increase time)  Sit to supine Modified independent (Device/Increase time)  General bed mobility comments cues for technique  Transfers  Overall transfer level Needs assistance  Equipment used Rolling walker (2 wheeled)  Transfers Sit to/from Stand  Sit to Stand Supervision;Modified independent (Device/Increase time)  Ambulation/Gait  Ambulation/Gait assistance Supervision;Modified independent (Device/Increase time)  Ambulation Distance (Feet) 120 Feet  Assistive device Rolling walker (2 wheeled)  Gait Pattern/deviations Step-to pattern;Decreased stride length;Step-through pattern  General Gait Details cues for step length, RW safety  Stairs Yes  Stairs assistance Min guard  Stair Management With cane;One rail Left;Forwards;Step to pattern  Number of Stairs 5  General stair comments cues for sequence  Total Joint Exercises  Ankle Circles/Pumps AROM;Right;10 reps;Supine  Quad Sets AROM;10 reps;Right  Goniometric ROM grossly -5 to 60*   Heel Slides  AROM;AAROM;Right;10 reps  Hip ABduction/ADduction AROM;Strengthening;Right;5 reps  Straight Leg Raises AROM;Strengthening;Right;10 reps  Towel Squeeze AROM;Strengthening;Both;5 reps  Short Arc Quad AROM;AAROM;Right;10 reps  PT - End of Session  Equipment Utilized During Treatment Gait belt  Activity Tolerance Patient tolerated treatment well  Patient left in bed;with call bell/phone within reach;with family/visitor present  Nurse Communication Mobility status  PT - Assessment/Plan  PT Plan Current plan remains appropriate  PT Frequency (ACUTE ONLY) 7X/week  Follow Up Recommendations Home health PT;Outpatient PT  PT equipment None recommended by PT  PT Goal Progression  Progress towards PT goals Progressing toward goals  Acute Rehab PT Goals  PT Goal Formulation With patient  Time For Goal Achievement 05/30/14  Potential to Achieve Goals Good  PT General Charges  $$ ACUTE PT VISIT 1 Procedure  PT Treatments  $Gait Training 8-22 mins  $Therapeutic Exercise 8-22 mins

## 2014-05-30 NOTE — Progress Notes (Signed)
     Subjective: 1 Day Post-Op Procedure(s) (LRB): RIGHT TOTAL KNEE ARTHROPLASTY WITH REMOVAL OF HARDWARE (Right)   Seen by Dr. Alvan Dame. Patient reports pain as mild, pain controlled. No events throughout the night. Ready to be discharged home.  Objective:   VITALS:   Filed Vitals:   05/30/14 0950  BP: 131/72  Pulse: 55  Temp: 98.5 F (36.9 C)  Resp: 16    Dorsiflexion/Plantar flexion intact Incision: dressing C/D/I No cellulitis present Compartment soft  LABS  Recent Labs  05/30/14 0513  HGB 13.5  HCT 37.5*  WBC 14.2*  PLT 203     Recent Labs  05/30/14 0513  NA 141  K 4.5  BUN 14  CREATININE 1.03  GLUCOSE 132*     Assessment/Plan: 1 Day Post-Op Procedure(s) (LRB): RIGHT TOTAL KNEE ARTHROPLASTY WITH REMOVAL OF HARDWARE (Right) Foley cath d/c'ed Advance diet Up with therapy D/C IV fluids Discharge home with home health  Follow [facility] in 2 [facility]. Follow up with OLIN,Breshae Belcher D in 2 weeks.  Contact information:  [facility] [address] [place] [phone]    Obese (BMI 30-39.9) Estimated body mass index is 31.63 kg/(m^2) as calculated from the following:   Height as of this encounter: 5\' 8"  (1.727 m).   Weight as of this encounter: 94.348 kg (208 lb). Patient also counseled that weight may inhibit the healing process Patient counseled that losing weight will help with future health issues        West Pugh. Mariaclara Spear   PAC  05/30/2014, 12:30 PM

## 2014-05-31 NOTE — Progress Notes (Signed)
Utilization review completed.  

## 2014-06-02 ENCOUNTER — Encounter (HOSPITAL_COMMUNITY): Admission: RE | Payer: Self-pay | Source: Ambulatory Visit

## 2014-06-02 ENCOUNTER — Inpatient Hospital Stay (HOSPITAL_COMMUNITY): Admission: RE | Admit: 2014-06-02 | Payer: Self-pay | Source: Ambulatory Visit | Admitting: Specialist

## 2014-06-02 SURGERY — ARTHROSCOPY, KNEE
Anesthesia: Spinal | Site: Knee | Laterality: Right

## 2014-06-05 NOTE — Discharge Summary (Signed)
Physician Discharge Summary  Patient ID: Lawrence Compton MRN: [id number] DOB/AGE: 10/02/1955 58 y.o.  Admit date: 05/29/2014 Discharge date: 05/30/2014   Procedures:  Procedure(s) (LRB): RIGHT TOTAL KNEE ARTHROPLASTY WITH REMOVAL OF HARDWARE (Right)  Attending Physician:  Dr. Paralee Cancel   Admission Diagnoses:   Right knee primary OA / pain  Discharge Diagnoses:  Principal Problem:   S/P right TKA Active Problems:   Obese  Past Medical History  Diagnosis Date  . PONV (postoperative nausea and vomiting)   . Hypertension   . Arthritis   . Cancer     melanoma right chest 2013    HPI: Lawrence Compton, 58 y.o. male, has a history of pain and functional disability in the right knee due to arthritis and has failed non-surgical conservative treatments for greater than 12 weeks to includeNSAID's and/or analgesics, corticosteriod injections, viscosupplementation injections, use of assistive devices and activity modification. Onset of symptoms was gradual, starting years ago with gradually worsening course since that time. The patient noted prior procedures on the knee to include arthroscopy and ACL reconstruction on the right knee(s). Patient currently rates pain in the right knee(s) at 9 out of 10 with activity. Patient has worsening of pain with activity and weight bearing, pain that interferes with activities of daily living, pain with passive range of motion, crepitus and joint swelling. Patient has evidence of periarticular osteophytes and joint space narrowing by imaging studies. There is no active infection. Risks, benefits and expectations were discussed with the patient. Risks including but not limited to the risk of anesthesia, blood clots, nerve damage, blood vessel damage, failure of the prosthesis, infection and up to and including death. Patient understand the risks, benefits and expectations and wishes to proceed with surgery.  PCP: Gerrit Heck, MD    Discharged Condition: good  Hospital Course:  Patient underwent the above stated procedure on 05/29/2014. Patient tolerated the procedure well and brought to the recovery room in good condition and subsequently to the floor.  POD #1 BP: 131/72 ; Pulse: 55 ; Temp: 98.5 F (36.9 C) ; Resp: 16 Patient reports pain as mild, pain controlled. No events throughout the night. Ready to be discharged home. Dorsiflexion/plantar flexion intact, incision: dressing C/D/I, no cellulitis present and compartment soft.   LABS  Basename    HGB  13.5  HCT  37.5    Discharge Exam: General appearance: alert, cooperative and no distress Extremities: Homans sign is negative, no sign of DVT, no edema, redness or tenderness in the calves or thighs and no ulcers, gangrene or trophic changes  Disposition: Home with follow up in 2 weeks   Follow-up Information    Follow up with [facility].   Why:  home health physical therapy   Contact information:   [address] [place] [address] [phone]       Follow up with Mauri Pole, MD. Schedule an appointment as soon as possible for a visit in 2 weeks.   Specialty:  Orthopedic Surgery   Contact information:   [address] [place] [address] [phone]       Discharge Instructions    Call MD / Call 911    Complete by:  As directed   If you experience chest pain or shortness of breath, CALL 911 and be transported to the hospital emergency room.  If you develope a fever above 101 F, pus (white drainage) or increased drainage or redness at the wound, or calf pain,  call your surgeon's office.     Change dressing    Complete by:  As directed   Maintain surgical dressing for 10-14 days, or until follow up in the clinic.     Constipation Prevention    Complete by:  As directed   Drink plenty of fluids.  Prune juice may be helpful.  You may use a stool softener, such as Colace (over the counter) 100 mg  twice a day.  Use MiraLax (over the counter) for constipation as needed.     Diet - low sodium heart healthy    Complete by:  As directed      Discharge instructions    Complete by:  As directed   Maintain surgical dressing for 10-14 days, or until follow up in the clinic. Follow up in 2 weeks at [facility]. Call with any questions or concerns.     Driving restrictions    Complete by:  As directed   No driving for 4 weeks     Increase activity slowly as tolerated    Complete by:  As directed      TED hose    Complete by:  As directed   Use stockings (TED hose) for 2 weeks on both leg(s).  You may remove them at night for sleeping.     Weight bearing as tolerated    Complete by:  As directed   Laterality:  right  Extremity:  Lower             Medication List    STOP taking these medications        acetaminophen 500 MG tablet  Commonly known as:  TYLENOL      TAKE these medications        aspirin 325 MG EC tablet  Take 1 tablet (325 mg total) by mouth 2 (two) times daily.     DSS 100 MG Caps  Take 100 mg by mouth 2 (two) times daily.     ferrous sulfate 325 (65 FE) MG tablet  Take 1 tablet (325 mg total) by mouth 3 (three) times daily after meals.     HYDROcodone-acetaminophen 7.5-325 MG per tablet  Commonly known as:  NORCO  Take 1-2 tablets by mouth every 4 (four) hours as needed for moderate pain.     lisinopril-hydrochlorothiazide 10-12.5 MG per tablet  Commonly known as:  PRINZIDE,ZESTORETIC  Take 1 tablet by mouth every morning.     methocarbamol 500 MG tablet  Commonly known as:  ROBAXIN  Take 1 tablet (500 mg total) by mouth every 6 (six) hours as needed for muscle spasms.     OMEGA 3 PO  Take 1 capsule by mouth daily.     polyethylene glycol packet  Commonly known as:  MIRALAX / GLYCOLAX  Take 17 g by mouth 2 (two) times daily.     TART CHERRY ADVANCED PO  Take 1 tablet by mouth daily.         Signed: West Pugh. Dearia Wilmouth    PA-C  06/05/2014, 3:21 PM

## 2015-09-21 IMAGING — CR DG CHEST 2V
2 series · 2 of 2 positions shown · non-contrast
Comparison: 03/08/2008

CLINICAL DATA: Preop for right knee surgery

EXAM:
CHEST  2 VIEW

[w chest pa]
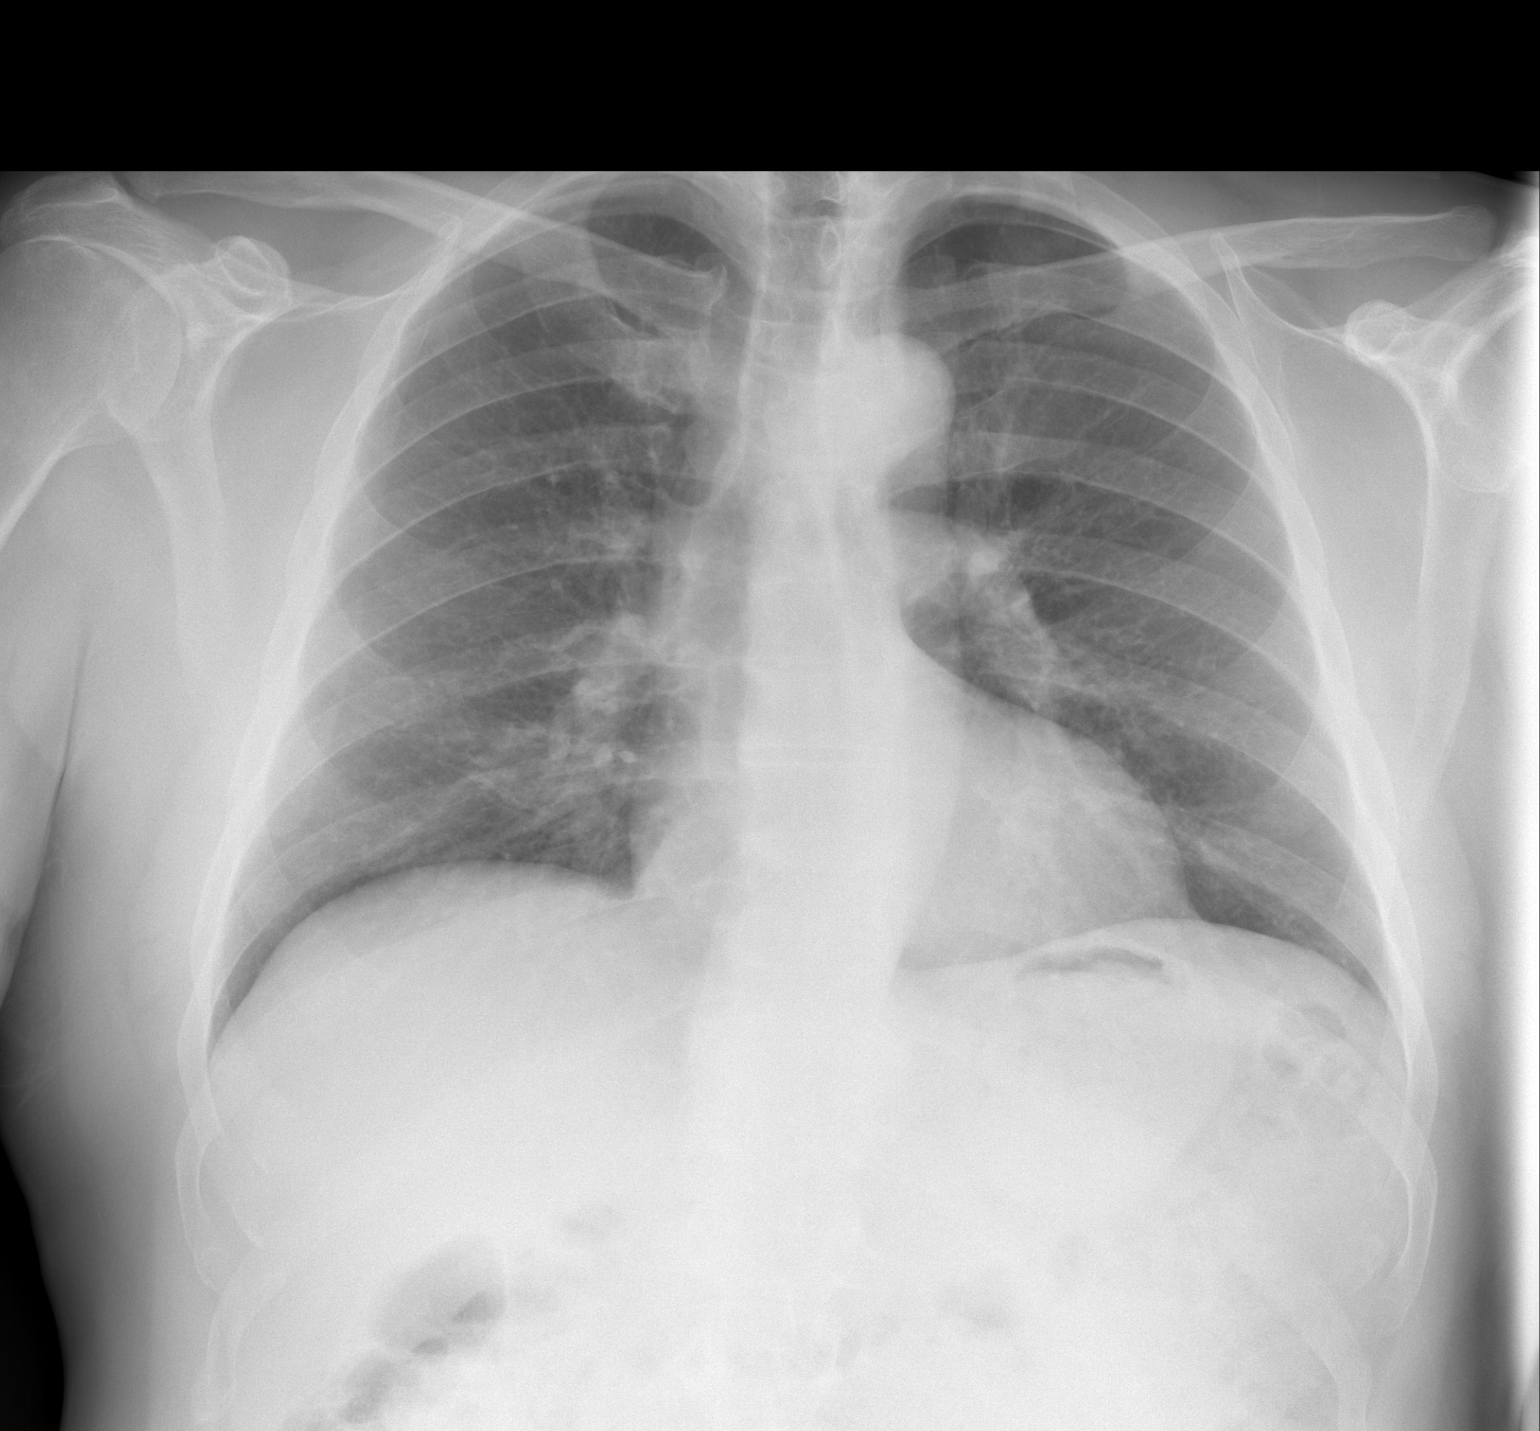

[w chest lat]
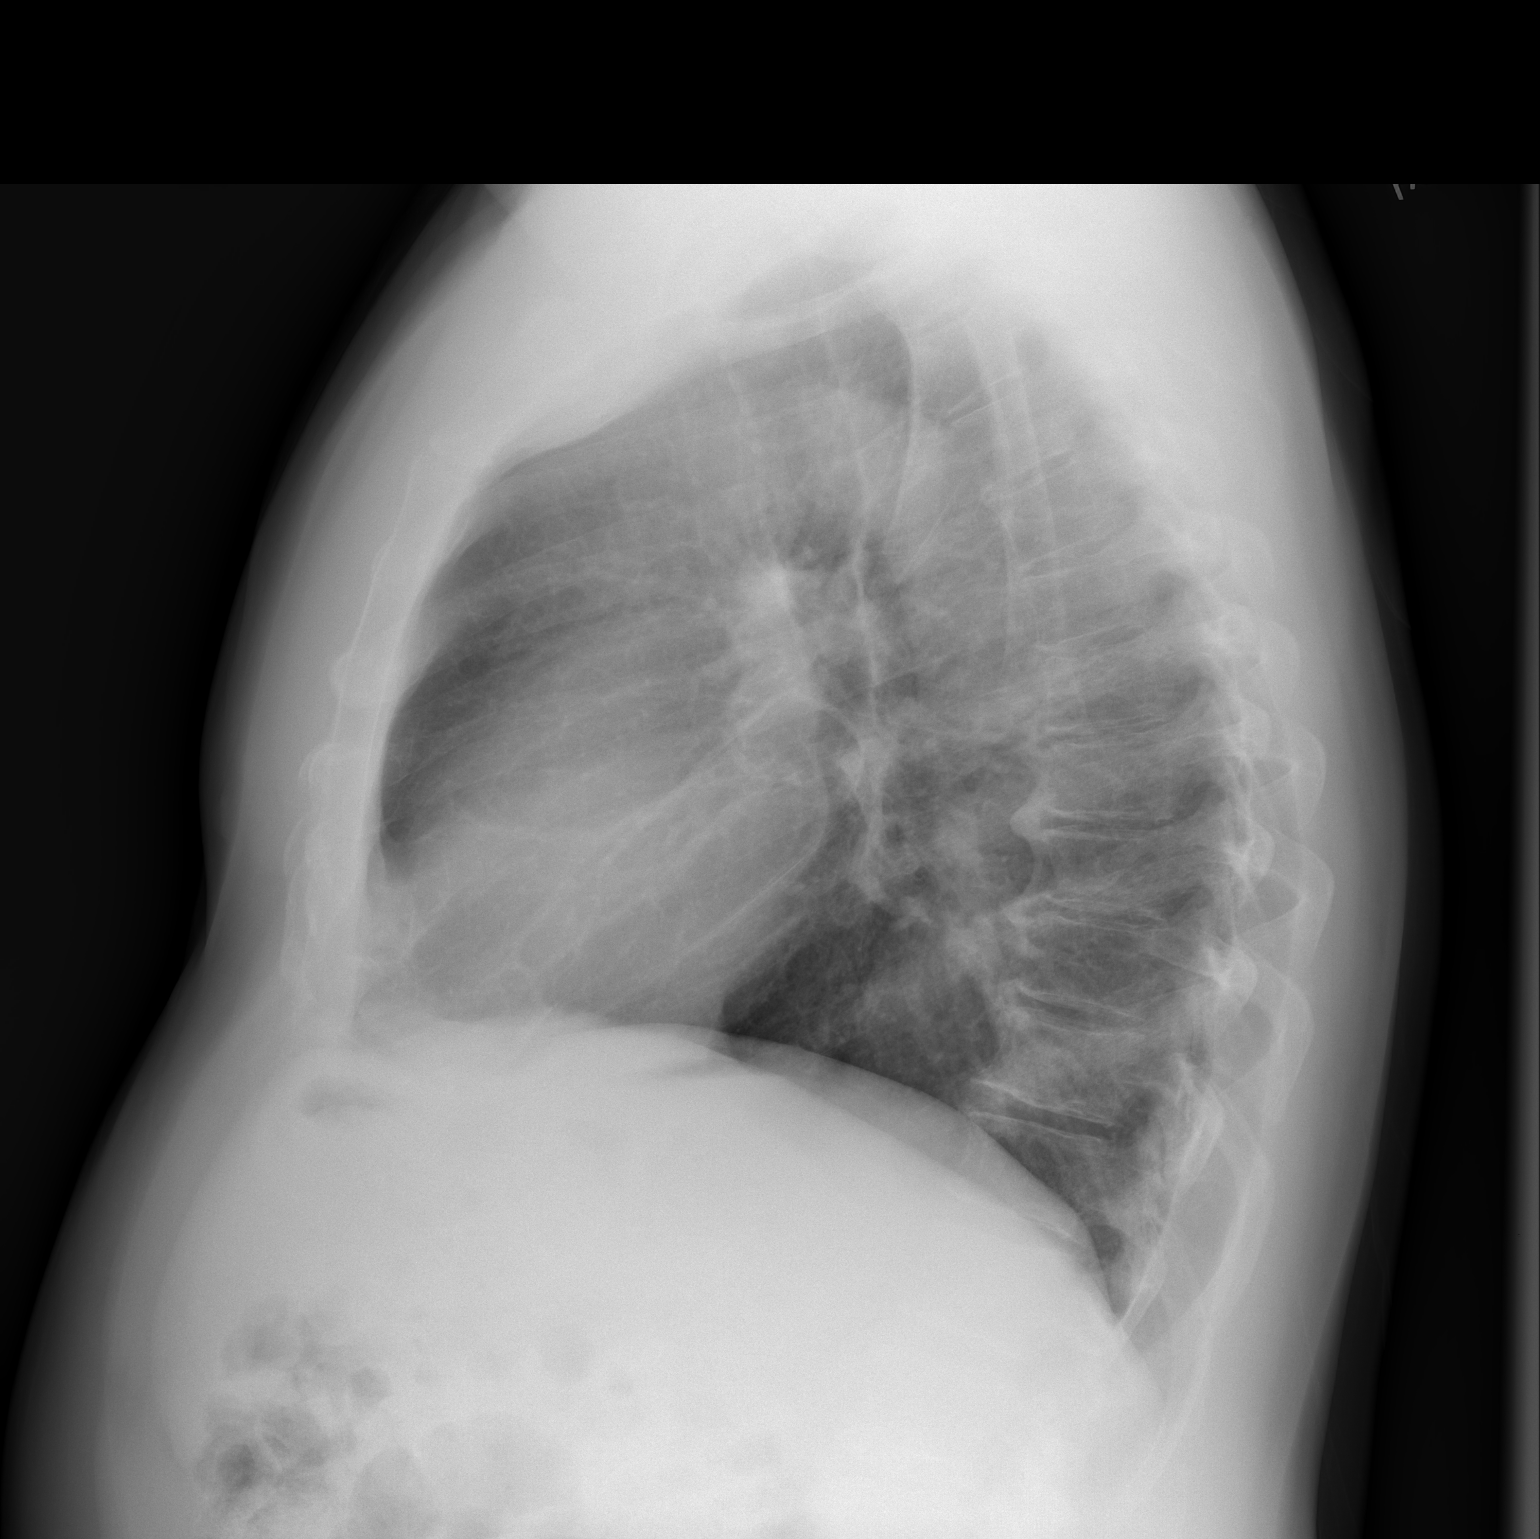

[2 of 2 positions shown; findings below may reference images not displayed]

FINDINGS: Cardiomediastinal silhouette is stable. No acute infiltrate or
pleural effusion. No pulmonary edema. Mild degenerative changes
thoracic spine.
IMPRESSION: No active cardiopulmonary disease.

## 2015-12-24 DIAGNOSIS — Z0000 Encounter for general adult medical examination without abnormal findings: Secondary | ICD-10-CM | POA: Diagnosis not present

## 2015-12-24 DIAGNOSIS — I10 Essential (primary) hypertension: Secondary | ICD-10-CM | POA: Diagnosis not present

## 2015-12-24 DIAGNOSIS — N400 Enlarged prostate without lower urinary tract symptoms: Secondary | ICD-10-CM | POA: Diagnosis not present

## 2015-12-24 DIAGNOSIS — E785 Hyperlipidemia, unspecified: Secondary | ICD-10-CM | POA: Diagnosis not present

## 2015-12-27 DIAGNOSIS — Z0000 Encounter for general adult medical examination without abnormal findings: Secondary | ICD-10-CM | POA: Diagnosis not present

## 2015-12-27 DIAGNOSIS — Z23 Encounter for immunization: Secondary | ICD-10-CM | POA: Diagnosis not present

## 2017-01-06 DIAGNOSIS — Z0000 Encounter for general adult medical examination without abnormal findings: Secondary | ICD-10-CM | POA: Diagnosis not present

## 2017-01-07 DIAGNOSIS — E785 Hyperlipidemia, unspecified: Secondary | ICD-10-CM | POA: Diagnosis not present

## 2017-01-07 DIAGNOSIS — N400 Enlarged prostate without lower urinary tract symptoms: Secondary | ICD-10-CM | POA: Diagnosis not present

## 2017-01-07 DIAGNOSIS — I10 Essential (primary) hypertension: Secondary | ICD-10-CM | POA: Diagnosis not present

## 2017-03-23 DIAGNOSIS — R972 Elevated prostate specific antigen [PSA]: Secondary | ICD-10-CM | POA: Diagnosis not present

## 2017-04-28 DIAGNOSIS — D229 Melanocytic nevi, unspecified: Secondary | ICD-10-CM | POA: Diagnosis not present

## 2017-04-28 DIAGNOSIS — Z85820 Personal history of malignant melanoma of skin: Secondary | ICD-10-CM | POA: Diagnosis not present

## 2017-04-28 DIAGNOSIS — D1800 Hemangioma unspecified site: Secondary | ICD-10-CM | POA: Diagnosis not present

## 2017-10-10 ENCOUNTER — Encounter (HOSPITAL_COMMUNITY): Payer: Self-pay

## 2017-10-10 ENCOUNTER — Emergency Department (HOSPITAL_COMMUNITY)
Admission: EM | Admit: 2017-10-10 | Discharge: 2017-10-10 | Disposition: A | Discharge status: Home / Self Care | Payer: BLUE CROSS/BLUE SHIELD | Attending: Emergency Medicine | Admitting: Emergency Medicine

## 2017-10-10 ENCOUNTER — Other Ambulatory Visit: Payer: Self-pay

## 2017-10-10 ENCOUNTER — Emergency Department (HOSPITAL_COMMUNITY): Payer: BLUE CROSS/BLUE SHIELD

## 2017-10-10 DIAGNOSIS — I10 Essential (primary) hypertension: Secondary | ICD-10-CM | POA: Diagnosis not present

## 2017-10-10 DIAGNOSIS — N23 Unspecified renal colic: Secondary | ICD-10-CM

## 2017-10-10 DIAGNOSIS — N132 Hydronephrosis with renal and ureteral calculous obstruction: Secondary | ICD-10-CM | POA: Diagnosis not present

## 2017-10-10 DIAGNOSIS — R61 Generalized hyperhidrosis: Secondary | ICD-10-CM | POA: Diagnosis not present

## 2017-10-10 DIAGNOSIS — Z79899 Other long term (current) drug therapy: Secondary | ICD-10-CM | POA: Insufficient documentation

## 2017-10-10 DIAGNOSIS — R110 Nausea: Secondary | ICD-10-CM | POA: Diagnosis not present

## 2017-10-10 DIAGNOSIS — R109 Unspecified abdominal pain: Secondary | ICD-10-CM | POA: Diagnosis present

## 2017-10-10 DIAGNOSIS — Z85820 Personal history of malignant melanoma of skin: Secondary | ICD-10-CM | POA: Diagnosis not present

## 2017-10-10 LAB — CBC WITH DIFFERENTIAL/PLATELET
Basophils Absolute: 0 10*3/uL (ref 0.0–0.1)
Basophils Relative: 0 %
Eosinophils Absolute: 0.1 10*3/uL (ref 0.0–0.7)
Eosinophils Relative: 1 %
HCT: 44.1 % (ref 39.0–52.0)
Hemoglobin: 15.2 g/dL (ref 13.0–17.0)
Lymphocytes Relative: 13 %
Lymphs Abs: 1.7 10*3/uL (ref 0.7–4.0)
MCH: 30 pg (ref 26.0–34.0)
MCHC: 34.5 g/dL (ref 30.0–36.0)
MCV: 87.2 fL (ref 78.0–100.0)
Monocytes Absolute: 0.9 10*3/uL (ref 0.1–1.0)
Monocytes Relative: 7 %
Neutro Abs: 9.9 10*3/uL — ABNORMAL HIGH (ref 1.7–7.7)
Neutrophils Relative %: 79 %
Platelets: 182 10*3/uL (ref 150–400)
RBC: 5.06 MIL/uL (ref 4.22–5.81)
RDW: 12.3 % (ref 11.5–15.5)
WBC: 12.5 10*3/uL — ABNORMAL HIGH (ref 4.0–10.5)

## 2017-10-10 LAB — URINALYSIS, ROUTINE W REFLEX MICROSCOPIC
Bacteria, UA: NONE SEEN
Bilirubin Urine: NEGATIVE
Glucose, UA: NEGATIVE mg/dL
Ketones, ur: 5 mg/dL — AB
Leukocytes, UA: NEGATIVE
Nitrite: NEGATIVE
Protein, ur: NEGATIVE mg/dL
Specific Gravity, Urine: 1.021 (ref 1.005–1.030)
Squamous Epithelial / LPF: NONE SEEN
pH: 5 (ref 5.0–8.0)

## 2017-10-10 LAB — COMPREHENSIVE METABOLIC PANEL
ALT: 21 U/L (ref 17–63)
AST: 26 U/L (ref 15–41)
Albumin: 4.1 g/dL (ref 3.5–5.0)
Alkaline Phosphatase: 45 U/L (ref 38–126)
Anion gap: 12 (ref 5–15)
BUN: 27 mg/dL — ABNORMAL HIGH (ref 6–20)
CO2: 22 mmol/L (ref 22–32)
Calcium: 9.2 mg/dL (ref 8.9–10.3)
Chloride: 105 mmol/L (ref 101–111)
Creatinine, Ser: 1.71 mg/dL — ABNORMAL HIGH (ref 0.61–1.24)
GFR calc Af Amer: 48 mL/min — ABNORMAL LOW (ref >60)
GFR calc non Af Amer: 41 mL/min — ABNORMAL LOW (ref >60)
Glucose, Bld: 94 mg/dL (ref 65–99)
Potassium: 3.9 mmol/L (ref 3.5–5.1)
Sodium: 139 mmol/L (ref 135–145)
Total Bilirubin: 0.8 mg/dL (ref 0.3–1.2)
Total Protein: 7 g/dL (ref 6.5–8.1)

## 2017-10-10 MED ORDER — SODIUM CHLORIDE 0.9 % IV BOLUS
1000.0000 mL | Freq: Once | INTRAVENOUS | Status: AC
  Administered 2017-10-10: 1000 mL via INTRAVENOUS

## 2017-10-10 MED ORDER — MORPHINE SULFATE (PF) 4 MG/ML IV SOLN
4.0000 mg | Freq: Once | INTRAVENOUS | Status: AC
  Administered 2017-10-10: 4 mg via INTRAVENOUS
  Filled 2017-10-10: qty 1

## 2017-10-10 MED ORDER — OXYCODONE-ACETAMINOPHEN 5-325 MG PO TABS: 1.0000 | ORAL_TABLET | Freq: Four times a day (QID) | ORAL | 0 refills | Status: DC | PRN

## 2017-10-10 MED ORDER — TAMSULOSIN HCL 0.4 MG PO CAPS: 0.4000 mg | ORAL_CAPSULE | Freq: Every day | ORAL | 0 refills | Status: AC

## 2017-10-10 MED ORDER — KETOROLAC TROMETHAMINE 15 MG/ML IJ SOLN
15.0000 mg | Freq: Once | INTRAMUSCULAR | Status: AC
  Administered 2017-10-10: 15 mg via INTRAVENOUS
  Filled 2017-10-10: qty 1

## 2017-10-10 NOTE — Discharge Instructions (Addendum)
You have a 2 mm kidney stone on the right side.  Get rechecked immediately if you develop fever, can't urinate, uncontrolled pain or new concerning symptoms. Your creatinine (renal function) was elevated today at 1.7.  Please follow up with your doctor in the next week for recheck.

## 2017-10-10 NOTE — ED Provider Notes (Signed)
[place] DEPT Provider Note   CSN: [id number] Arrival date & time: 10/10/17  1713     History   Chief Complaint Chief Complaint  Patient presents with  . Flank Pain    HPI Lawrence Compton is a 62 y.o. male.  The history is provided by the patient. No language interpreter was used.   Lawrence Compton is a 62 y.o. male who presents to the Emergency Department complaining of flank pain. He presents to the emergency department for evaluation of right-sided flank pain that began at 4 AM and woke him from sleep. He had associated nausea with diaphoresis. Pain is nonradiating and constant in nature. There are no alleviating or worsening factors. He denies any fevers, chest pain, shortness of breath, abdominal pain, vomiting, dysuria, hematuria. Symptoms are moderate and constant in nature. No prior similar symptoms. Past Medical History:  Diagnosis Date  . Arthritis   . Cancer [facility])    melanoma right chest 2013  . Hypertension   . PONV (postoperative nausea and vomiting)     Patient Active Problem List   Diagnosis Date Noted  . Obese 05/30/2014  . 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 ACL implant     right knee   . colonscopy     . Excision of melanoma on chest wall  2013  . TOTAL KNEE ARTHROPLASTY Right 05/29/2014   Procedure: RIGHT TOTAL KNEE ARTHROPLASTY WITH REMOVAL OF HARDWARE;  Surgeon: Mauri Pole, MD;  Location: [facility];  Service: Orthopedics;  Laterality: Right;        Home Medications    Prior to Admission medications   Medication Sig Start Date End Date Taking? Authorizing Provider  docusate sodium 100 MG CAPS Take 100 mg by mouth 2 (two) times daily. 05/30/14   Danae Orleans, PA-C  ferrous sulfate 325 (65 FE) MG tablet Take 1 tablet (325 mg total) by mouth 3 (three) times daily after meals. 05/30/14   Danae Orleans, PA-C  HYDROcodone-acetaminophen (NORCO) 7.5-325 MG per tablet Take  1-2 tablets by mouth every 4 (four) hours as needed for moderate pain. 05/30/14   Danae Orleans, PA-C  lisinopril-hydrochlorothiazide (PRINZIDE,ZESTORETIC) 10-12.5 MG per tablet Take 1 tablet by mouth every morning.  02/06/14   [provider]  methocarbamol (ROBAXIN) 500 MG tablet Take 1 tablet (500 mg total) by mouth every 6 (six) hours as needed for muscle spasms. 05/30/14   Danae Orleans, PA-C  Misc Natural Products (TART CHERRY ADVANCED PO) Take 1 tablet by mouth daily.    [provider]  Omega-3 Fatty Acids (OMEGA 3 PO) Take 1 capsule by mouth daily.    [provider]  oxyCODONE-acetaminophen (PERCOCET/ROXICET) 5-325 MG tablet Take 1 tablet by mouth every 6 (six) hours as needed for severe pain. 10/10/17   Quintella Reichert, MD  polyethylene glycol [facility] Floria Raveling) packet Take 17 g by mouth 2 (two) times daily. 05/30/14   Danae Orleans, PA-C  tamsulosin (FLOMAX) 0.4 MG CAPS capsule Take 1 capsule (0.4 mg total) by mouth daily. 10/10/17   Quintella Reichert, MD    Family History No family history on file.  Social History Social History   Tobacco Use  . Smoking status: Never Smoker  . Smokeless tobacco: Never Used  Substance Use Topics  . Alcohol use: Yes    Comment: occas   . Drug use: No     Allergies   Patient has no known allergies.  Review of Systems Review of Systems  All other systems reviewed and are negative.    Physical Exam Updated Vital Signs BP 132/89 (BP Location: Left Arm)   Pulse (!) 58   Temp 97.9 F (36.6 C) (Oral)   Resp 17   Ht 5\' 8"  (1.727 m)   Wt 93.9 kg (207 lb)   SpO2 96%   BMI 31.47 kg/m   Physical Exam  Constitutional: He is oriented to person, place, and time. He appears well-developed and well-nourished.  HENT:  Head: Normocephalic and atraumatic.  Cardiovascular: Normal rate and regular rhythm.  No murmur heard. Pulmonary/Chest: Effort normal and breath sounds normal. No respiratory distress.    Abdominal: Soft. There is no tenderness. There is no rebound and no guarding.  No cva tenderness  Musculoskeletal: He exhibits no edema or tenderness.  5/5 strength in all four extremities.   Neurological: He is alert and oriented to person, place, and time.  Skin: Skin is warm and dry.  Psychiatric: He has a normal mood and affect. His behavior is normal.  Nursing note and vitals reviewed.    ED Treatments / Results  Labs (all labs ordered are listed, but only abnormal results are displayed) Labs Reviewed  COMPREHENSIVE METABOLIC PANEL - Abnormal; Notable for the following components:      Result Value   BUN 27 (*)    Creatinine, Ser 1.71 (*)    GFR calc non Af Amer 41 (*)    GFR calc Af Amer 48 (*)    All other components within normal limits  CBC WITH DIFFERENTIAL/PLATELET - Abnormal; Notable for the following components:   WBC 12.5 (*)    Neutro Abs 9.9 (*)    All other components within normal limits  URINALYSIS, ROUTINE W REFLEX MICROSCOPIC - Abnormal; Notable for the following components:   Hgb urine dipstick MODERATE (*)    Ketones, ur 5 (*)    All other components within normal limits    EKG None  Radiology Ct Renal Stone Study  Result Date: 10/10/2017 CLINICAL DATA:  Right-sided flank pain. EXAM: CT ABDOMEN AND PELVIS WITHOUT CONTRAST TECHNIQUE: Multidetector CT imaging of the abdomen and pelvis was performed following the standard protocol without IV contrast. COMPARISON:  None. FINDINGS: Lower chest: No acute findings. Hepatobiliary: No mass visualized on this unenhanced exam. Gallbladder is unremarkable. Pancreas: No mass or inflammatory process visualized on this unenhanced exam. Spleen:  Within normal limits in size. Adrenals/Urinary tract: Mild right hydroureteronephrosis and perinephric stranding is seen due to a 2 mm calculus at the right ureterovesical junction. No other urinary calculi are identified. Stomach/Bowel: No evidence of obstruction, inflammatory  process, or abnormal fluid collections. Vascular/Lymphatic: No pathologically enlarged lymph nodes identified. No evidence of abdominal aortic aneurysm. Aortic atherosclerosis. Reproductive:  No mass or other significant abnormality. Other:  None. Musculoskeletal:  No suspicious bone lesions identified. IMPRESSION: Mild right hydroureteronephrosis and perinephric stranding due to a 2 mm calculus at the right ureterovesical junction. Electronically Signed   By: Earle Gell M.D.   On: 10/10/2017 18:35    Procedures Procedures (including critical care time)  Medications Ordered in ED Medications  sodium chloride 0.9 % bolus 1,000 mL (0 mLs Intravenous Stopped 10/10/17 1952)  ketorolac (TORADOL) 15 MG/ML injection 15 mg (15 mg Intravenous Given 10/10/17 1852)  morphine 4 MG/ML injection 4 mg (4 mg Intravenous Given 10/10/17 1941)     Initial Impression / Assessment and Plan / ED Course  I have reviewed the triage  vital signs and the nursing notes.  Pertinent labs & imaging results that were available during my care of the patient were reviewed by me and considered in my medical decision making (see chart for details).     Patient here for evaluation of right flank pain, nausea. CT does demonstrate a right ureteral stone. UA is not consistent with UTI. BMP does note mild elevation in his creatinine compared to prior four years ago. Lewis and pain medications in the department. On repeat assessment his pain was resolved. Discussed with patient findings of obstructing stone. Discussed outpatient PCP as well as Urology follow-up. Discussed home care and return precautions.  Final Clinical Impressions(s) / ED Diagnoses   Final diagnoses:  Renal colic on right side    ED Discharge Orders        Ordered    oxyCODONE-acetaminophen (PERCOCET/ROXICET) 5-325 MG tablet  Every 6 hours PRN     10/10/17 2047    tamsulosin (FLOMAX) 0.4 MG CAPS capsule  Daily     10/10/17 2047       Quintella Reichert,  MD 10/10/17 2353

## 2017-10-10 NOTE — ED Triage Notes (Addendum)
Right sided flank pain. Pt has been hiking the [place] trail, but states it does not feel like over exertion. C/o nausea. No relief with ibuprofen and hydrocodone.

## 2017-10-27 DIAGNOSIS — N201 Calculus of ureter: Secondary | ICD-10-CM | POA: Diagnosis not present

## 2017-11-02 DIAGNOSIS — M25562 Pain in left knee: Secondary | ICD-10-CM | POA: Diagnosis not present

## 2017-11-05 DIAGNOSIS — N200 Calculus of kidney: Secondary | ICD-10-CM | POA: Diagnosis not present

## 2017-11-13 DIAGNOSIS — M25562 Pain in left knee: Secondary | ICD-10-CM | POA: Diagnosis not present

## 2017-11-13 DIAGNOSIS — M238X2 Other internal derangements of left knee: Secondary | ICD-10-CM | POA: Diagnosis not present

## 2017-11-13 DIAGNOSIS — M1711 Unilateral primary osteoarthritis, right knee: Secondary | ICD-10-CM | POA: Diagnosis not present

## 2018-01-11 DIAGNOSIS — Z1159 Encounter for screening for other viral diseases: Secondary | ICD-10-CM | POA: Diagnosis not present

## 2018-01-11 DIAGNOSIS — R972 Elevated prostate specific antigen [PSA]: Secondary | ICD-10-CM | POA: Diagnosis not present

## 2018-01-11 DIAGNOSIS — E785 Hyperlipidemia, unspecified: Secondary | ICD-10-CM | POA: Diagnosis not present

## 2018-01-11 DIAGNOSIS — Z0000 Encounter for general adult medical examination without abnormal findings: Secondary | ICD-10-CM | POA: Diagnosis not present

## 2018-03-23 DIAGNOSIS — R972 Elevated prostate specific antigen [PSA]: Secondary | ICD-10-CM | POA: Diagnosis not present

## 2018-05-22 DIAGNOSIS — M25562 Pain in left knee: Secondary | ICD-10-CM | POA: Diagnosis not present

## 2018-05-26 DIAGNOSIS — M25562 Pain in left knee: Secondary | ICD-10-CM | POA: Diagnosis not present

## 2018-05-26 DIAGNOSIS — M1712 Unilateral primary osteoarthritis, left knee: Secondary | ICD-10-CM | POA: Diagnosis not present

## 2018-06-10 DIAGNOSIS — M1712 Unilateral primary osteoarthritis, left knee: Secondary | ICD-10-CM | POA: Diagnosis not present

## 2018-06-17 DIAGNOSIS — Z01818 Encounter for other preprocedural examination: Secondary | ICD-10-CM | POA: Diagnosis not present

## 2018-06-17 DIAGNOSIS — I10 Essential (primary) hypertension: Secondary | ICD-10-CM | POA: Diagnosis not present

## 2018-06-17 DIAGNOSIS — M1712 Unilateral primary osteoarthritis, left knee: Secondary | ICD-10-CM | POA: Diagnosis not present

## 2018-06-21 DIAGNOSIS — Z01818 Encounter for other preprocedural examination: Secondary | ICD-10-CM | POA: Diagnosis not present

## 2018-06-21 DIAGNOSIS — Z131 Encounter for screening for diabetes mellitus: Secondary | ICD-10-CM | POA: Diagnosis not present

## 2018-07-05 DIAGNOSIS — Z96651 Presence of right artificial knee joint: Secondary | ICD-10-CM | POA: Diagnosis not present

## 2018-07-05 DIAGNOSIS — M1712 Unilateral primary osteoarthritis, left knee: Secondary | ICD-10-CM | POA: Diagnosis not present

## 2018-08-25 DIAGNOSIS — Z471 Aftercare following joint replacement surgery: Secondary | ICD-10-CM | POA: Diagnosis not present

## 2018-08-25 DIAGNOSIS — Z96652 Presence of left artificial knee joint: Secondary | ICD-10-CM | POA: Diagnosis not present

## 2018-09-08 DIAGNOSIS — D485 Neoplasm of uncertain behavior of skin: Secondary | ICD-10-CM | POA: Diagnosis not present

## 2018-09-08 DIAGNOSIS — L821 Other seborrheic keratosis: Secondary | ICD-10-CM | POA: Diagnosis not present

## 2018-09-08 DIAGNOSIS — D229 Melanocytic nevi, unspecified: Secondary | ICD-10-CM | POA: Diagnosis not present

## 2018-09-08 DIAGNOSIS — L111 Transient acantholytic dermatosis [Grover]: Secondary | ICD-10-CM | POA: Diagnosis not present

## 2019-02-09 IMAGING — CT CT RENAL STONE PROTOCOL
2 of 4 series · 17 of 46 positions shown, 19 images · non-contrast
Comparison: None.

CLINICAL DATA: Right-sided flank pain.

EXAM:
CT ABDOMEN AND PELVIS WITHOUT CONTRAST
TECHNIQUE: Multidetector CT imaging of the abdomen and pelvis was performed
following the standard protocol without IV contrast.

[Series 2: axial st · axial · 0.90mm/px · z∈[-398,-8]mm · 14 of 90 slices shown, 16 images]
[im 6/90  soft-tissue]
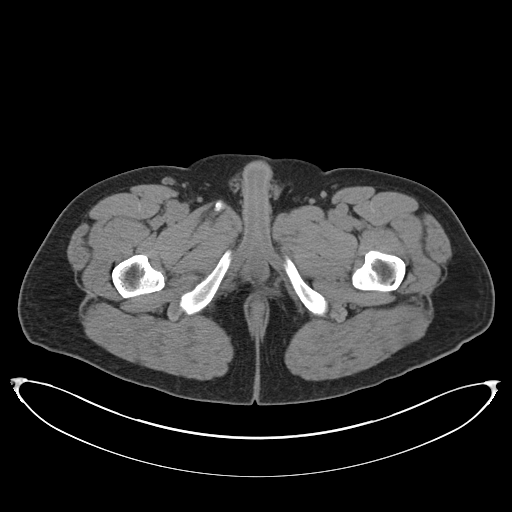
[im 6/90  bone]
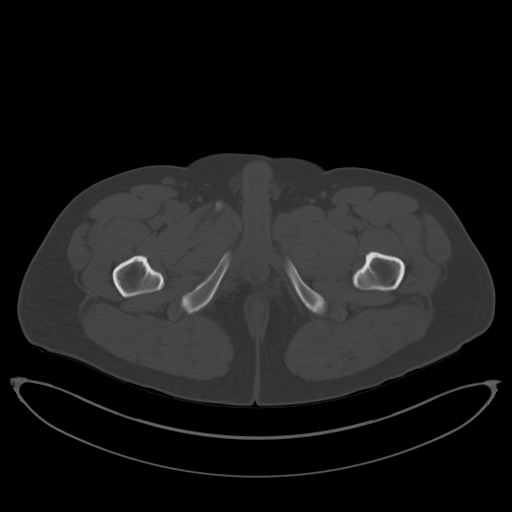
[im 11/90  soft-tissue]
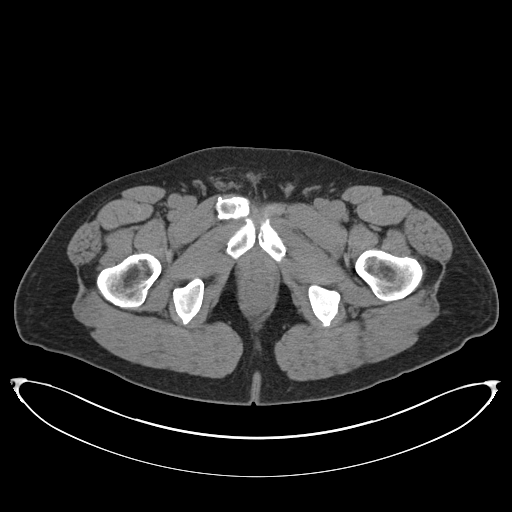
[im 16/90  soft-tissue]
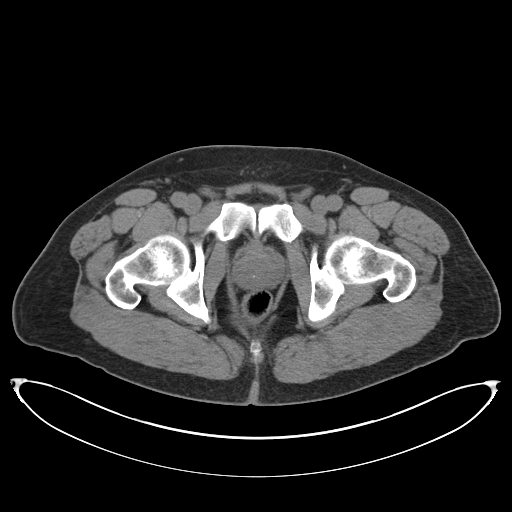
[im 27/90  soft-tissue]
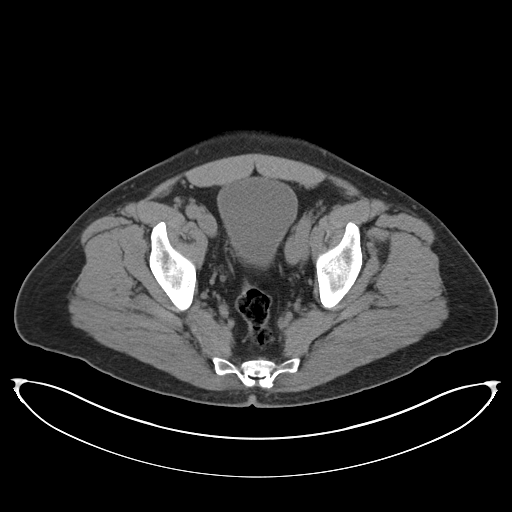
[im 32/90  soft-tissue]
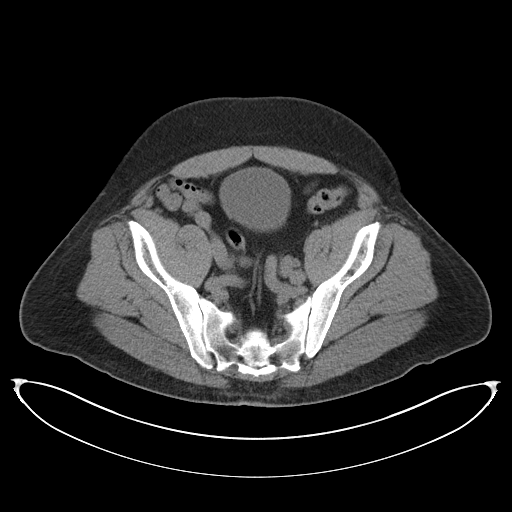
[im 37/90  soft-tissue]
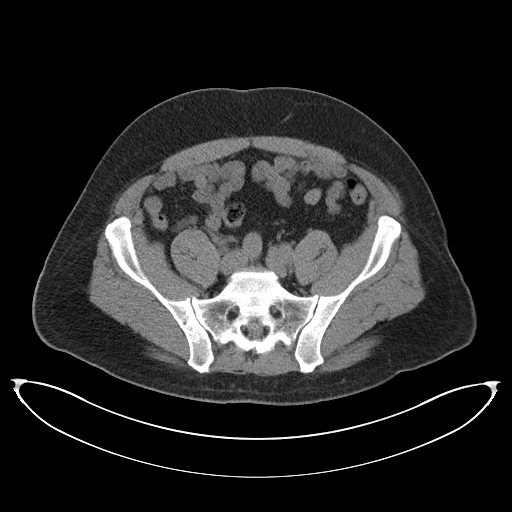
[im 42/90  soft-tissue]
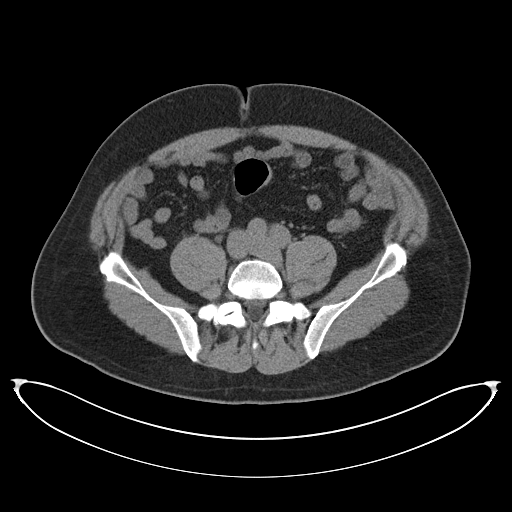
[im 48/90  soft-tissue]
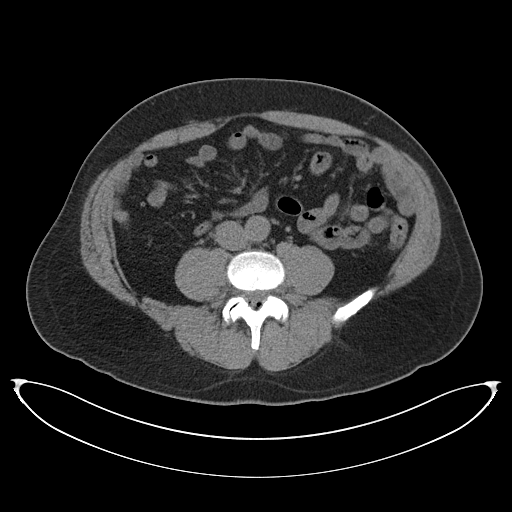
[im 53/90  soft-tissue]
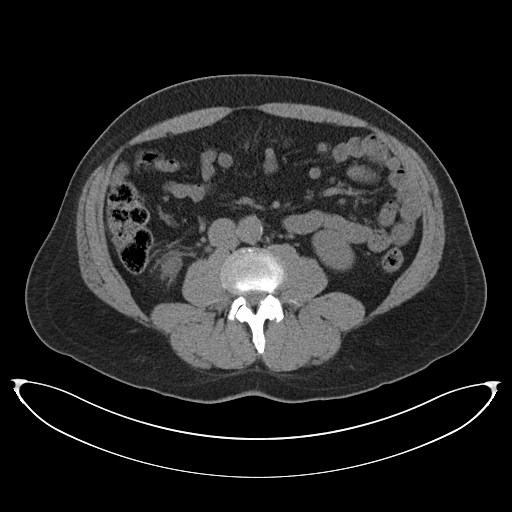
[im 53/90  bone]
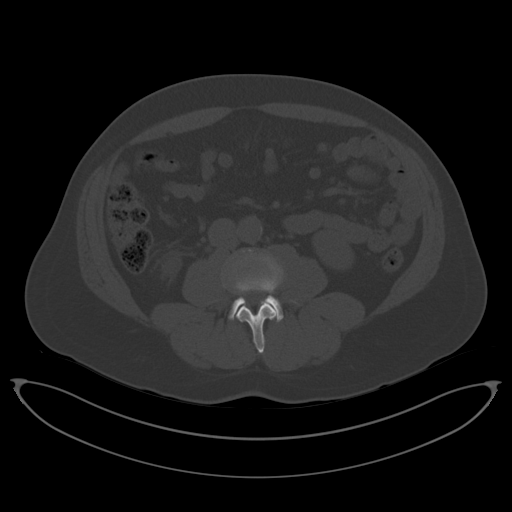
[im 58/90  soft-tissue]
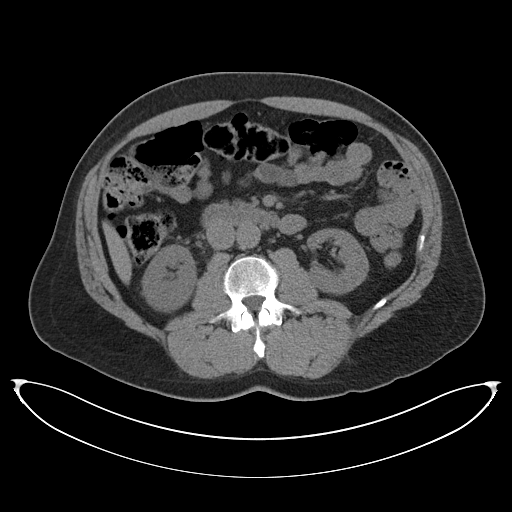
[im 69/90  soft-tissue]
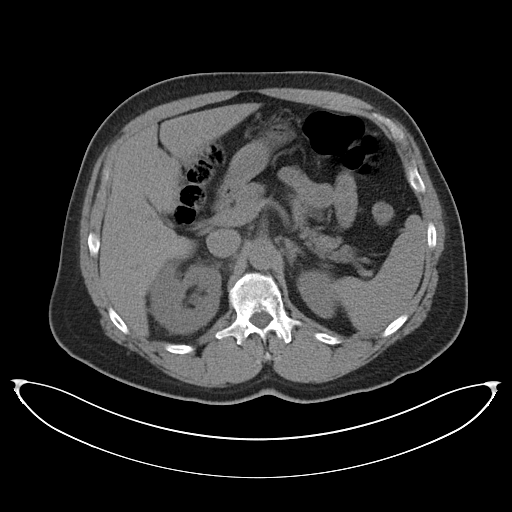
[im 74/90  soft-tissue]
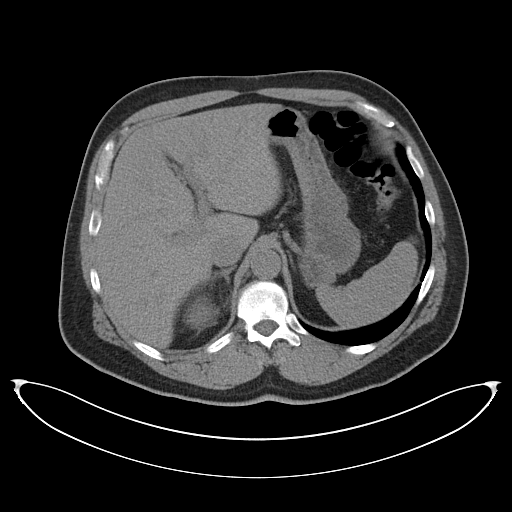
[im 79/90  soft-tissue]
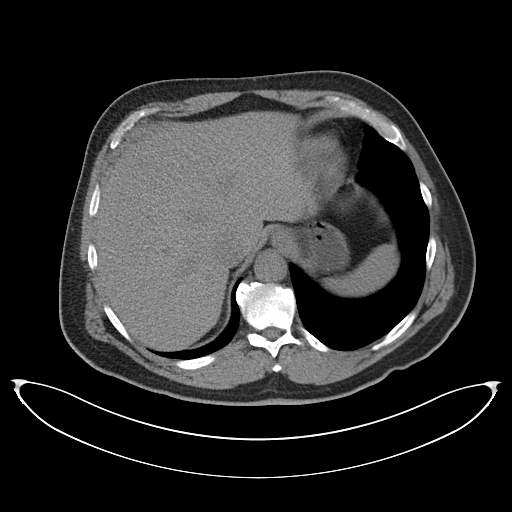
[im 84/90  soft-tissue]
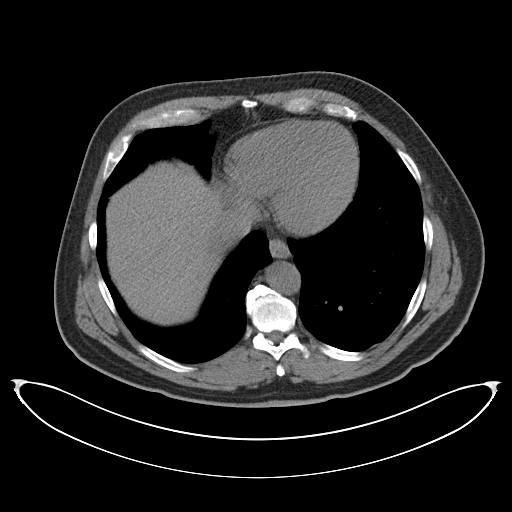

[Series 5: coronal · coronal · 0.81mm/px · 3 of 170 slices shown]
[im 57/170  soft-tissue]
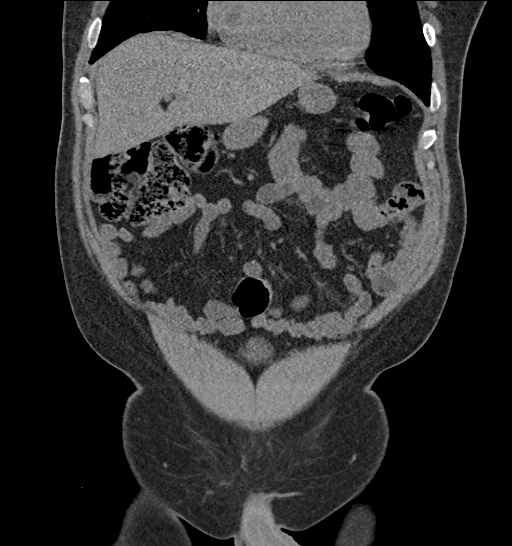
[im 76/170  soft-tissue]
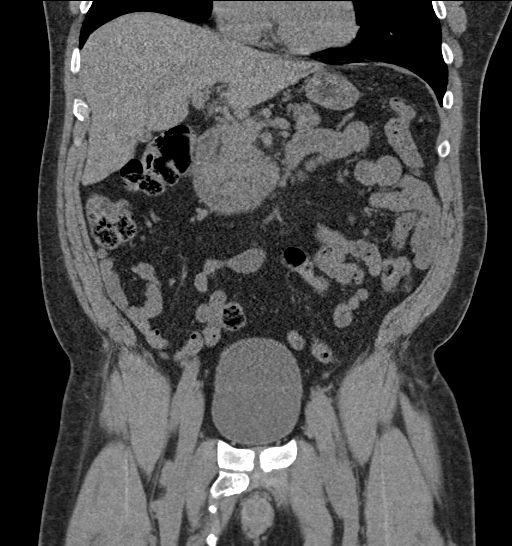
[im 94/170  soft-tissue]
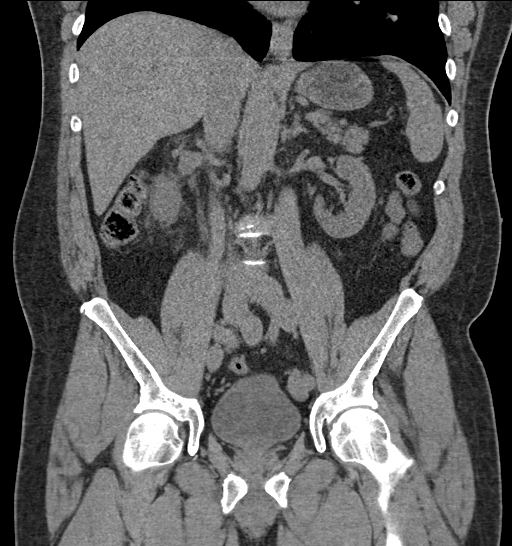

[17 of 46 positions shown; findings below may reference images not displayed]

FINDINGS: Lower chest: No acute findings.

Hepatobiliary: No mass visualized on this unenhanced exam.
Gallbladder is unremarkable.

Pancreas: No mass or inflammatory process visualized on this
unenhanced exam.

Spleen:  Within normal limits in size.

Adrenals/Urinary tract: Mild right hydroureteronephrosis and
perinephric stranding is seen due to a 2 mm calculus at the right
ureterovesical junction. No other urinary calculi are identified.

Stomach/Bowel: No evidence of obstruction, inflammatory process, or
abnormal fluid collections.

Vascular/Lymphatic: No pathologically enlarged lymph nodes
identified. No evidence of abdominal aortic aneurysm. Aortic
atherosclerosis.

Reproductive:  No mass or other significant abnormality.

Other:  None.

Musculoskeletal:  No suspicious bone lesions identified.
IMPRESSION: Mild right hydroureteronephrosis and perinephric stranding due to a
2 mm calculus at the right ureterovesical junction.

## 2019-04-05 DIAGNOSIS — Z125 Encounter for screening for malignant neoplasm of prostate: Secondary | ICD-10-CM | POA: Diagnosis not present

## 2019-04-12 DIAGNOSIS — R350 Frequency of micturition: Secondary | ICD-10-CM | POA: Diagnosis not present

## 2019-04-12 DIAGNOSIS — Z125 Encounter for screening for malignant neoplasm of prostate: Secondary | ICD-10-CM | POA: Diagnosis not present

## 2019-07-11 DIAGNOSIS — Z125 Encounter for screening for malignant neoplasm of prostate: Secondary | ICD-10-CM | POA: Diagnosis not present

## 2019-07-26 DIAGNOSIS — R972 Elevated prostate specific antigen [PSA]: Secondary | ICD-10-CM | POA: Diagnosis not present

## 2019-07-26 DIAGNOSIS — R350 Frequency of micturition: Secondary | ICD-10-CM | POA: Diagnosis not present

## 2019-07-28 DIAGNOSIS — Z96653 Presence of artificial knee joint, bilateral: Secondary | ICD-10-CM | POA: Diagnosis not present

## 2019-07-28 DIAGNOSIS — Z471 Aftercare following joint replacement surgery: Secondary | ICD-10-CM | POA: Diagnosis not present

## 2019-07-28 DIAGNOSIS — Z96652 Presence of left artificial knee joint: Secondary | ICD-10-CM | POA: Diagnosis not present

## 2019-08-25 DIAGNOSIS — Z1159 Encounter for screening for other viral diseases: Secondary | ICD-10-CM | POA: Diagnosis not present

## 2019-08-30 DIAGNOSIS — Z86010 Personal history of colonic polyps: Secondary | ICD-10-CM | POA: Diagnosis not present

## 2020-02-13 DIAGNOSIS — Z85820 Personal history of malignant melanoma of skin: Secondary | ICD-10-CM | POA: Diagnosis not present

## 2020-02-13 DIAGNOSIS — N400 Enlarged prostate without lower urinary tract symptoms: Secondary | ICD-10-CM | POA: Diagnosis not present

## 2020-02-13 DIAGNOSIS — E785 Hyperlipidemia, unspecified: Secondary | ICD-10-CM | POA: Diagnosis not present

## 2020-02-13 DIAGNOSIS — I10 Essential (primary) hypertension: Secondary | ICD-10-CM | POA: Diagnosis not present

## 2020-07-04 ENCOUNTER — Ambulatory Visit: Payer: BLUE CROSS/BLUE SHIELD | Admitting: Dermatology

## 2020-08-13 ENCOUNTER — Other Ambulatory Visit: Payer: Self-pay

## 2020-08-13 ENCOUNTER — Ambulatory Visit (INDEPENDENT_AMBULATORY_CARE_PROVIDER_SITE_OTHER): Payer: Medicare Other | Admitting: Dermatology

## 2020-08-13 ENCOUNTER — Encounter: Payer: Self-pay | Admitting: Dermatology

## 2020-08-13 DIAGNOSIS — Z1283 Encounter for screening for malignant neoplasm of skin: Secondary | ICD-10-CM

## 2020-08-13 DIAGNOSIS — Z86018 Personal history of other benign neoplasm: Secondary | ICD-10-CM | POA: Diagnosis not present

## 2020-08-13 DIAGNOSIS — Z85828 Personal history of other malignant neoplasm of skin: Secondary | ICD-10-CM | POA: Diagnosis not present

## 2020-08-13 DIAGNOSIS — D2371 Other benign neoplasm of skin of right lower limb, including hip: Secondary | ICD-10-CM

## 2020-08-13 DIAGNOSIS — D239 Other benign neoplasm of skin, unspecified: Secondary | ICD-10-CM

## 2020-08-13 DIAGNOSIS — Z85820 Personal history of malignant melanoma of skin: Secondary | ICD-10-CM | POA: Diagnosis not present

## 2020-08-13 DIAGNOSIS — Z87898 Personal history of other specified conditions: Secondary | ICD-10-CM

## 2020-08-13 DIAGNOSIS — D1801 Hemangioma of skin and subcutaneous tissue: Secondary | ICD-10-CM

## 2020-08-13 DIAGNOSIS — L821 Other seborrheic keratosis: Secondary | ICD-10-CM

## 2020-08-13 DIAGNOSIS — L738 Other specified follicular disorders: Secondary | ICD-10-CM

## 2020-08-16 DIAGNOSIS — R972 Elevated prostate specific antigen [PSA]: Secondary | ICD-10-CM | POA: Diagnosis not present

## 2020-08-23 ENCOUNTER — Encounter: Payer: Self-pay | Admitting: Dermatology

## 2020-08-24 NOTE — Progress Notes (Signed)
   Follow-Up Visit   Subjective  Lawrence Compton is a 65 y.o. male who presents for the following: Annual Exam (Skin ckeck).  Complete skin examination Location:  Duration:  Quality:  Associated Signs/Symptoms: Modifying Factors:  Severity:  Timing: Context: History of melanoma  Objective  Well appearing patient in no apparent distress; mood and affect are within normal limits. Objective  Left Frontal Scalp: Back and chest: Flattopped textured brown 3 to 5 mm papules  Objective  Chest - Medial (Center): Feet to face General skin examination: No new or recurrent atypical pigmented lesions, no nonmelanoma skin cancer.  Objective  Chest - Medial [facility]): No repigmentation scar, no regional adenopathy.  Objective  Left Breast: Smooth 3 mm red papule  Objective  Right Knee - Anterior: Firm 5 mm pink dermal papule  Objective  Mid Back: No recurrence  Objective  Mid Tip of Nose: Upper nose 3 mm flesh-colored papule with eccentric dell    A full examination was performed including scalp, head, eyes, ears, nose, lips, neck, chest, axillae, abdomen, back, buttocks, bilateral upper extremities, bilateral lower extremities, hands, feet, fingers, toes, fingernails, and toenails. All findings within normal limits unless otherwise noted below.   Assessment & Plan    Seborrheic keratosis Left Frontal Scalp  Leave if stable.  Screening exam for skin cancer Chest - Medial [facility])  Annual skin examination, encouraged to self examine twice annually. Continued ultraviolet protection.  Personal history of malignant melanoma of skin Chest - Medial [facility])  Annual skin examination.  Cherry angioma Left Breast  No intervention currently indicated  Dermatofibroma Right Knee - Anterior  Leave if stable  History of basal cell carcinoma (BCC) Neck - Anterior  History of atypical nevus Mid Back  Check as needed change  Sebaceous hyperplasia Mid Tip of  Nose  Told Lawrence Compton that these may mimic the appearance of early basal cell carcinoma so if there is growth or bleeding he should return for biopsy.      I, Lawrence Monarch, MD, have reviewed all documentation for this visit.  The documentation on 08/24/20 for the exam, diagnosis, procedures, and orders are all accurate and complete.

## 2020-08-27 DIAGNOSIS — R350 Frequency of micturition: Secondary | ICD-10-CM | POA: Diagnosis not present

## 2020-08-27 DIAGNOSIS — N5201 Erectile dysfunction due to arterial insufficiency: Secondary | ICD-10-CM | POA: Diagnosis not present

## 2020-09-07 DIAGNOSIS — N3000 Acute cystitis without hematuria: Secondary | ICD-10-CM | POA: Diagnosis not present

## 2020-09-07 DIAGNOSIS — R3914 Feeling of incomplete bladder emptying: Secondary | ICD-10-CM | POA: Diagnosis not present

## 2020-10-05 DIAGNOSIS — M7731 Calcaneal spur, right foot: Secondary | ICD-10-CM | POA: Diagnosis not present

## 2020-10-05 DIAGNOSIS — M722 Plantar fascial fibromatosis: Secondary | ICD-10-CM | POA: Diagnosis not present

## 2020-10-10 DIAGNOSIS — R3914 Feeling of incomplete bladder emptying: Secondary | ICD-10-CM | POA: Diagnosis not present

## 2020-10-10 DIAGNOSIS — N3000 Acute cystitis without hematuria: Secondary | ICD-10-CM | POA: Diagnosis not present

## 2020-10-11 DIAGNOSIS — M722 Plantar fascial fibromatosis: Secondary | ICD-10-CM | POA: Diagnosis not present

## 2020-10-11 DIAGNOSIS — M71571 Other bursitis, not elsewhere classified, right ankle and foot: Secondary | ICD-10-CM | POA: Diagnosis not present

## 2020-10-18 DIAGNOSIS — M722 Plantar fascial fibromatosis: Secondary | ICD-10-CM | POA: Diagnosis not present

## 2020-10-25 DIAGNOSIS — M722 Plantar fascial fibromatosis: Secondary | ICD-10-CM | POA: Diagnosis not present

## 2020-10-25 DIAGNOSIS — M71571 Other bursitis, not elsewhere classified, right ankle and foot: Secondary | ICD-10-CM | POA: Diagnosis not present

## 2020-11-19 DIAGNOSIS — Z87442 Personal history of urinary calculi: Secondary | ICD-10-CM | POA: Diagnosis not present

## 2020-11-19 DIAGNOSIS — M5137 Other intervertebral disc degeneration, lumbosacral region: Secondary | ICD-10-CM | POA: Diagnosis not present

## 2020-11-19 DIAGNOSIS — R1084 Generalized abdominal pain: Secondary | ICD-10-CM | POA: Diagnosis not present

## 2021-01-08 DIAGNOSIS — I10 Essential (primary) hypertension: Secondary | ICD-10-CM | POA: Diagnosis not present

## 2021-01-08 DIAGNOSIS — Z6831 Body mass index (BMI) 31.0-31.9, adult: Secondary | ICD-10-CM | POA: Diagnosis not present

## 2021-07-17 DIAGNOSIS — N400 Enlarged prostate without lower urinary tract symptoms: Secondary | ICD-10-CM | POA: Diagnosis not present

## 2021-07-17 DIAGNOSIS — Z0000 Encounter for general adult medical examination without abnormal findings: Secondary | ICD-10-CM | POA: Diagnosis not present

## 2021-07-17 DIAGNOSIS — I10 Essential (primary) hypertension: Secondary | ICD-10-CM | POA: Diagnosis not present

## 2021-07-17 DIAGNOSIS — E785 Hyperlipidemia, unspecified: Secondary | ICD-10-CM | POA: Diagnosis not present

## 2021-07-18 DIAGNOSIS — I4891 Unspecified atrial fibrillation: Secondary | ICD-10-CM | POA: Diagnosis not present

## 2021-07-18 DIAGNOSIS — Z0000 Encounter for general adult medical examination without abnormal findings: Secondary | ICD-10-CM | POA: Diagnosis not present

## 2021-07-18 DIAGNOSIS — I10 Essential (primary) hypertension: Secondary | ICD-10-CM | POA: Diagnosis not present

## 2021-07-18 DIAGNOSIS — E785 Hyperlipidemia, unspecified: Secondary | ICD-10-CM | POA: Diagnosis not present

## 2021-08-01 ENCOUNTER — Ambulatory Visit: Payer: Medicare Other | Admitting: Internal Medicine

## 2021-08-01 ENCOUNTER — Other Ambulatory Visit: Payer: Self-pay

## 2021-08-01 ENCOUNTER — Encounter: Payer: Self-pay | Admitting: Internal Medicine

## 2021-08-01 VITALS — BP 132/82 | HR 64 | Ht 68.0 in | Wt 207.8 lb

## 2021-08-01 DIAGNOSIS — Z01818 Encounter for other preprocedural examination: Secondary | ICD-10-CM | POA: Diagnosis not present

## 2021-08-01 DIAGNOSIS — I480 Paroxysmal atrial fibrillation: Secondary | ICD-10-CM | POA: Diagnosis not present

## 2021-08-01 DIAGNOSIS — Z01812 Encounter for preprocedural laboratory examination: Secondary | ICD-10-CM

## 2021-08-01 MED ORDER — DILTIAZEM HCL 30 MG PO TABS: ORAL_TABLET | ORAL | 6 refills | Status: AC

## 2021-08-01 MED ORDER — APIXABAN 5 MG PO TABS: 5.0000 mg | ORAL_TABLET | Freq: Two times a day (BID) | ORAL | 3 refills | Status: DC

## 2021-08-01 NOTE — Patient Instructions (Addendum)
Medication Instructions:  Continue Eliquis 5 mg twice a day  Start Diltiazem 30 mg take every 6 hours as needed for heart rate greater than 120  Do not take Aspirin,NSaids Ibuprofen,Aleve,Motrin  May take Tylenol if needed  Continue all other medications  *If you need a refill on your cardiac medications before your next appointment, please call your pharmacy*   Lab Work: Have bmet,cbc today   Testing/Procedures: Schedule Echo  Cardioversion scheduled 08/21/21  Follow instructions below   Follow-Up: At [facility], you and your health needs are our priority.  As part of our continuing mission to provide you with exceptional heart care, we have created designated Provider Care Teams.  These Care Teams include your primary Cardiologist (physician) and Advanced Practice Providers (APPs -  Physician Assistants and Nurse Practitioners) who all work together to provide you with the care you need, when you need it.  We recommend signing up for the patient portal called "MyChart".  Sign up information is provided on this After Visit Summary.  MyChart is used to connect with patients for Virtual Visits (Telemedicine).  Patients are able to view lab/test results, encounter notes, upcoming appointments, etc.  Non-urgent messages can be sent to your provider as well.   To learn more about what you can do with MyChart, go to [email or web address].    Your next appointment:  After Cardioversion    The format for your next appointment: Office   Provider:  Dr.Branch    You are scheduled for a Cardioversion on Tuesday 08/21/21 with Dr. Acie Fredrickson.  Please arrive at the [facility] (Main Entrance A) at [facility]: [address] [place], [place] [address] 11:00 am.  DIET: Nothing to eat or drink after midnight except a sip of water with medications (see medication instructions below)  FYI: For your safety, and to allow [place] to monitor your vital signs accurately during the  surgery/procedure we request that   if you have artificial nails, gel coating, SNS etc. Please have those removed prior to your surgery/procedure. Not having the nail coverings /polish removed may result in cancellation or delay of your surgery/procedure.   Medication Instructions:   Continue your anticoagulant: Eliquis You will need to continue your anticoagulant after your procedure until you  are told by your  Provider that it is safe to stop   Labs: Bmet,cbc today   You must have a responsible person to drive you home and stay in the waiting area during your procedure. Failure to do so could result in cancellation.  Bring your insurance cards.  *Special Note: Every effort is made to have your procedure done on time. Occasionally there are emergencies that occur at the hospital that may cause delays. Please be patient if a delay does occur.

## 2021-08-01 NOTE — Progress Notes (Signed)
Cardiology Office Note:    Date:  08/01/2021   ID:  [name], DOB 05/18/1956, MRN [id number]  PCP:  Leighton Ruff, MD (Inactive)   [facility] Providers Cardiologist:  None     Referring MD: Kristen Loader, FNP   No chief complaint on file. New Onset Atrial Fibrillation  History of Present Illness:    Lawrence Compton is a 66 y.o. male with a hx of htn, hld, melanoma on the chest wall s/p removal referral from [place] for new onset atrial fibrillation  He presents with atrial fibrillation. He was given eliquis samples. No palpitations. He denies syncope. He notes that his heart rates can get to the 40s.  No hx of lung disease. No signs of hx of sleep apnea. He has not had a sleep study.  He denies smoking history. His mother has atrial fibrillation and has challenges with RVR.  In terms of his past cardiac hx. He has no other hx of cardiac dx. He had chest pain  in 2009 and underwent stress test. He had ACS r/o prior in the ED.   He's on lisinopril- HCTz for HTN. Good control.  He is very active and works out consistently.    He does no have chest pain and sob on exertion. No LE swelling. 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   Left Chest tx cx3 42fu   PONV (postoperative nausea and vomiting)     Past Surgical History:  Procedure Laterality Date   ACL implant     right knee    colonscopy      Excision of melanoma on chest wall  2013   TOTAL KNEE  ARTHROPLASTY Right 05/29/2014   Procedure: RIGHT TOTAL KNEE ARTHROPLASTY WITH REMOVAL OF HARDWARE;  Surgeon: Mauri Pole, MD;  Location: [facility];  Service: Orthopedics;  Laterality: Right;    Current Medications: Current Meds  Medication Sig   lisinopril-hydrochlorothiazide (ZESTORETIC) 20-12.5 MG tablet 1 tablet   Omega-3 Fatty Acids (OMEGA 3 PO) Take 1 capsule by mouth daily.   tamsulosin (FLOMAX) 0.4 MG CAPS capsule Take 1 capsule (0.4 mg total) by mouth daily.   [DISCONTINUED] lisinopril-hydrochlorothiazide (PRINZIDE,ZESTORETIC) 10-12.5 MG per tablet Take 1 tablet by mouth every morning.      Allergies:   Patient has no known allergies.   Social History   Socioeconomic History   Marital status: Married    Spouse name: Not on file   Number of children: Not on file   Years of education: Not on file   Highest education level: Not on file  Occupational History   Not on file  Tobacco Use   Smoking status: Never   Smokeless tobacco: Never  Vaping Use   Vaping Use: Not on file  Substance and Sexual Activity   Alcohol use: Yes    Comment: occas    Drug use: No   Sexual activity: Not on  file  Other Topics Concern   Not on file  Social History Narrative   Not on file   Social Determinants of Health   Financial Resource Strain: Not on file  Food Insecurity: Not on file  Transportation Needs: Not on file  Physical Activity: Not on file  Stress: Not on file  Social Connections: Not on file     Family History: Mother- atrial fibrillation. Father- passed, died of cancer. No cardiac disease in siblings. HTN.  ROS:   Please see the history of present illness.    All other systems reviewed and are negative.  EKGs/Labs/Other Studies Reviewed:    The following studies were reviewed today:   EKG:  EKG is  ordered today.  The ekg ordered today demonstrates   Afib rates 60s  Recent Labs: No results found for requested labs within last 8760 hours.  Recent Lipid  Panel    Component Value Date/Time   CHOL  03/09/2008 0710    169        ATP Compton CLASSIFICATION:  <200     mg/dL   Desirable  200-239  mg/dL   Borderline High  >=240    mg/dL   High   TRIG 159 (H) 03/09/2008 0710   HDL 31 (L) 03/09/2008 0710   CHOLHDL 5.5 03/09/2008 0710   VLDL 32 03/09/2008 0710   LDLCALC (H) 03/09/2008 0710    106        Total Cholesterol/HDL:CHD Risk Coronary Heart Disease Risk Table                     Men   Women  1/2 Average Risk   3.4   3.3     Risk Assessment/Calculations:    CHA2DS2-VASc Score = 2   This indicates a 2.2% annual risk of stroke. The patient's score is based upon: CHF History: 0 HTN History: 1 Diabetes History: 0 Stroke History: 0 Vascular Disease History: 0 Age Score: 1 Gender Score: 0          Physical Exam:    VS:   Vitals:   08/01/21 1349  BP: 132/82  Pulse: 64  SpO2: 98%      BP 132/82    Pulse 64    Ht 5\' 8"  (1.727 m)    Wt 207 lb 12.8 oz (94.3 kg)    SpO2 98%    BMI 31.60 kg/m     Wt Readings from Last 3 Encounters:  08/01/21 207 lb 12.8 oz (94.3 kg)  10/10/17 207 lb (93.9 kg)  05/29/14 208 lb (94.3 kg)     GEN:  Well nourished, well developed in no acute distress HEENT: Normal NECK: No JVD; No carotid bruits LYMPHATICS: No lymphadenopathy CARDIAC: RRR, no murmurs, rubs, gallops RESPIRATORY:  Clear to auscultation without rales, wheezing or rhonchi  ABDOMEN: Soft, non-tender, non-distended MUSCULOSKELETAL:  No edema; No deformity  SKIN: Warm and dry NEUROLOGIC:  Alert and oriented x 3 PSYCHIATRIC:  Normal affect   ASSESSMENT:    #Paroxysmal Atrial Fibrillation: Chads2vasc =2. He is in atrial fibrillation today, rate controlled. Notes HR down to 40s so hesitant to start BB. Will do dilt as needed.  He has no signs of CHF or CAD. Plan to do DCCV. If he does not maintain sinus rhythm will refer him to EP/afib clinic for consideration of AAD/PVI ( may be a good flecainide candidate) will obtain  echo. - refill eliquis - TTE - diltiazem PRN for HR > 120  bpm - DCCV   PLAN:    In order of problems listed above:  TTE Refill eliquis 5 mg BID DCCV Start dilt 30 mg q6 H PRN Follow up after DCCV      Shared Decision Making/Informed Consent The risks (stroke, cardiac arrhythmias rarely resulting in the need for a temporary or permanent pacemaker, skin irritation or burns and complications associated with conscious sedation including aspiration, arrhythmia, respiratory failure and death), benefits (restoration of normal sinus rhythm) and alternatives of a direct current cardioversion were explained in detail to Lawrence Compton and he agrees to proceed.      Medication Adjustments/Labs and Tests Ordered: Current medicines are reviewed at length with the patient today.  Concerns regarding medicines are outlined above.  Orders Placed This Encounter  Procedures   Basic metabolic panel   CBC w/Diff/Platelet   EKG 12-Lead   ECHOCARDIOGRAM COMPLETE   No orders of the defined types were placed in this encounter.   There are no Patient Instructions on file for this visit.   Signed, Janina Mayo, MD  08/01/2021 2:28 PM    [place]

## 2021-08-01 NOTE — H&P (View-Only) (Signed)
Cardiology Office Note:    Date:  08/01/2021   ID:  Lawrence Compton, DOB [birth year], MRN [id number]  PCP:  Lawrence Ruff, MD (Inactive)   [facility] Providers Cardiologist:  None     Referring MD: Lawrence Loader, FNP   No chief complaint on file. New Onset Atrial Fibrillation  History of Present Illness:    Lawrence Compton is a 66 y.o. male with a hx of htn, hld, melanoma on the chest wall s/p removal referral from [place] for new onset atrial fibrillation  He presents with atrial fibrillation. He was given eliquis samples. No palpitations. He denies syncope. He notes that his heart rates can get to the 40s.  No hx of lung disease. No signs of hx of sleep apnea. He has not had a sleep study.  He denies smoking history. His mother has atrial fibrillation and has challenges with RVR.  In terms of his past cardiac hx. He has no other hx of cardiac dx. He had chest pain  in 2009 and underwent stress test. He had ACS r/o prior in the ED.   He's on lisinopril- HCTz for HTN. Good control.  He is very active and works out consistently.    He does no have chest pain and sob on exertion. No LE swelling. 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   Left Chest tx cx3 53fu   PONV (postoperative nausea and vomiting)     Past Surgical History:  Procedure Laterality Date   ACL implant     right knee    colonscopy      Excision of melanoma on chest wall  2013   TOTAL KNEE  ARTHROPLASTY Right 05/29/2014   Procedure: RIGHT TOTAL KNEE ARTHROPLASTY WITH REMOVAL OF HARDWARE;  Surgeon: Lawrence Pole, MD;  Location: [facility];  Service: Orthopedics;  Laterality: Right;    Current Medications: Current Meds  Medication Sig   lisinopril-hydrochlorothiazide (ZESTORETIC) 20-12.5 MG tablet 1 tablet   Omega-3 Fatty Acids (OMEGA 3 PO) Take 1 capsule by mouth daily.   tamsulosin (FLOMAX) 0.4 MG CAPS capsule Take 1 capsule (0.4 mg total) by mouth daily.   [DISCONTINUED] lisinopril-hydrochlorothiazide (PRINZIDE,ZESTORETIC) 10-12.5 MG per tablet Take 1 tablet by mouth every morning.      Allergies:   Patient has no known allergies.   Social History   Socioeconomic History   Marital status: Married    Spouse name: Not on file   Number of children: Not on file   Years of education: Not on file   Highest education level: Not on file  Occupational History   Not on file  Tobacco Use   Smoking status: Never   Smokeless tobacco: Never  Vaping Use   Vaping Use: Not on file  Substance and Sexual Activity   Alcohol use: Yes    Comment: occas    Drug use: No   Sexual activity: Not on  file  Other Topics Concern   Not on file  Social History Narrative   Not on file   Social Determinants of Health   Financial Resource Strain: Not on file  Food Insecurity: Not on file  Transportation Needs: Not on file  Physical Activity: Not on file  Stress: Not on file  Social Connections: Not on file     Family History: Mother- atrial fibrillation. Father- passed, died of cancer. No cardiac disease in siblings. HTN.  ROS:   Please see the history of present illness.    All other systems reviewed and are negative.  EKGs/Labs/Other Studies Reviewed:    The following studies were reviewed today:   EKG:  EKG is  ordered today.  The ekg ordered today demonstrates   Afib rates 60s  Recent Labs: No results found for requested labs within last 8760 hours.  Recent Lipid  Panel    Component Value Date/Time   CHOL  03/09/2008 0710    169        ATP Compton CLASSIFICATION:  <200     mg/dL   Desirable  200-239  mg/dL   Borderline High  >=240    mg/dL   High   TRIG 159 (H) 03/09/2008 0710   HDL 31 (L) 03/09/2008 0710   CHOLHDL 5.5 03/09/2008 0710   VLDL 32 03/09/2008 0710   LDLCALC (H) 03/09/2008 0710    106        Total Cholesterol/HDL:CHD Risk Coronary Heart Disease Risk Table                     Men   Women  1/2 Average Risk   3.4   3.3     Risk Assessment/Calculations:    CHA2DS2-VASc Score = 2   This indicates a 2.2% annual risk of stroke. The patient's score is based upon: CHF History: 0 HTN History: 1 Diabetes History: 0 Stroke History: 0 Vascular Disease History: 0 Age Score: 1 Gender Score: 0          Physical Exam:    VS:   Vitals:   08/01/21 1349  BP: 132/82  Pulse: 64  SpO2: 98%      BP 132/82    Pulse 64    Ht 5\' 8"  (1.727 m)    Wt 207 lb 12.8 oz (94.3 kg)    SpO2 98%    BMI 31.60 kg/m     Wt Readings from Last 3 Encounters:  08/01/21 207 lb 12.8 oz (94.3 kg)  10/10/17 207 lb (93.9 kg)  05/29/14 208 lb (94.3 kg)     GEN:  Well nourished, well developed in no acute distress HEENT: Normal NECK: No JVD; No carotid bruits LYMPHATICS: No lymphadenopathy CARDIAC: RRR, no murmurs, rubs, gallops RESPIRATORY:  Clear to auscultation without rales, wheezing or rhonchi  ABDOMEN: Soft, non-tender, non-distended MUSCULOSKELETAL:  No edema; No deformity  SKIN: Warm and dry NEUROLOGIC:  Alert and oriented x 3 PSYCHIATRIC:  Normal affect   ASSESSMENT:    #Paroxysmal Atrial Fibrillation: Chads2vasc =2. He is in atrial fibrillation today, rate controlled. Notes HR down to 40s so hesitant to start BB. Will do dilt as needed.  He has no signs of CHF or CAD. Plan to do DCCV. If he does not maintain sinus rhythm will refer him to EP/afib clinic for consideration of AAD/PVI ( may be a good flecainide candidate) will obtain  echo. - refill eliquis - TTE - diltiazem PRN for HR > 120  bpm - DCCV   PLAN:    In order of problems listed above:  TTE Refill eliquis 5 mg BID DCCV Start dilt 30 mg q6 H PRN Follow up after DCCV      Shared Decision Making/Informed Consent The risks (stroke, cardiac arrhythmias rarely resulting in the need for a temporary or permanent pacemaker, skin irritation or burns and complications associated with conscious sedation including aspiration, arrhythmia, respiratory failure and death), benefits (restoration of normal sinus rhythm) and alternatives of a direct current cardioversion were explained in detail to Mr. Sivertsen and he agrees to proceed.      Medication Adjustments/Labs and Tests Ordered: Current medicines are reviewed at length with the patient today.  Concerns regarding medicines are outlined above.  Orders Placed This Encounter  Procedures   Basic metabolic panel   CBC w/Diff/Platelet   EKG 12-Lead   ECHOCARDIOGRAM COMPLETE   No orders of the defined types were placed in this encounter.   There are no Patient Instructions on file for this visit.   Signed, Janina Mayo, MD  08/01/2021 2:28 PM    [place]

## 2021-08-02 LAB — CBC WITH DIFFERENTIAL/PLATELET
Basophils Absolute: 0 10*3/uL (ref 0.0–0.2)
Basos: 0 %
EOS (ABSOLUTE): 0.1 10*3/uL (ref 0.0–0.4)
Eos: 2 %
Hematocrit: 47.2 % (ref 37.5–51.0)
Hemoglobin: 16.5 g/dL (ref 13.0–17.7)
Immature Grans (Abs): 0 10*3/uL (ref 0.0–0.1)
Immature Granulocytes: 0 %
Lymphocytes Absolute: 2.4 10*3/uL (ref 0.7–3.1)
Lymphs: 35 %
MCH: 30.4 pg (ref 26.6–33.0)
MCHC: 35 g/dL (ref 31.5–35.7)
MCV: 87 fL (ref 79–97)
Monocytes Absolute: 0.5 10*3/uL (ref 0.1–0.9)
Monocytes: 8 %
Neutrophils Absolute: 3.6 10*3/uL (ref 1.4–7.0)
Neutrophils: 55 %
Platelets: 188 10*3/uL (ref 150–450)
RBC: 5.43 x10E6/uL (ref 4.14–5.80)
RDW: 13.3 % (ref 11.6–15.4)
WBC: 6.7 10*3/uL (ref 3.4–10.8)

## 2021-08-02 LAB — BASIC METABOLIC PANEL
BUN/Creatinine Ratio: 16 (ref 10–24)
BUN: 22 mg/dL (ref 8–27)
CO2: 23 mmol/L (ref 20–29)
Calcium: 9.7 mg/dL (ref 8.6–10.2)
Chloride: 103 mmol/L (ref 96–106)
Creatinine, Ser: 1.38 mg/dL — ABNORMAL HIGH (ref 0.76–1.27)
Glucose: 78 mg/dL (ref 70–99)
Potassium: 4.2 mmol/L (ref 3.5–5.2)
Sodium: 140 mmol/L (ref 134–144)
eGFR: 57 mL/min/{1.73_m2} — ABNORMAL LOW (ref >59)

## 2021-08-07 ENCOUNTER — Other Ambulatory Visit: Payer: Self-pay | Admitting: *Deleted

## 2021-08-13 ENCOUNTER — Encounter (HOSPITAL_COMMUNITY): Payer: Self-pay | Admitting: Cardiovascular Disease

## 2021-08-13 ENCOUNTER — Other Ambulatory Visit: Payer: Self-pay

## 2021-08-13 ENCOUNTER — Ambulatory Visit: Payer: Medicare Other | Admitting: Dermatology

## 2021-08-13 DIAGNOSIS — L821 Other seborrheic keratosis: Secondary | ICD-10-CM | POA: Diagnosis not present

## 2021-08-13 DIAGNOSIS — D1801 Hemangioma of skin and subcutaneous tissue: Secondary | ICD-10-CM

## 2021-08-13 DIAGNOSIS — Z1283 Encounter for screening for malignant neoplasm of skin: Secondary | ICD-10-CM | POA: Diagnosis not present

## 2021-08-13 DIAGNOSIS — Z85820 Personal history of malignant melanoma of skin: Secondary | ICD-10-CM | POA: Diagnosis not present

## 2021-08-15 ENCOUNTER — Ambulatory Visit (HOSPITAL_COMMUNITY): Payer: Medicare Other | Attending: Internal Medicine

## 2021-08-15 ENCOUNTER — Other Ambulatory Visit: Payer: Self-pay

## 2021-08-15 DIAGNOSIS — Z01812 Encounter for preprocedural laboratory examination: Secondary | ICD-10-CM | POA: Diagnosis not present

## 2021-08-15 DIAGNOSIS — I480 Paroxysmal atrial fibrillation: Secondary | ICD-10-CM | POA: Diagnosis not present

## 2021-08-15 DIAGNOSIS — Z01810 Encounter for preprocedural cardiovascular examination: Secondary | ICD-10-CM | POA: Diagnosis not present

## 2021-08-15 LAB — ECHOCARDIOGRAM COMPLETE
Area-P 1/2: 4.49 cm2
S' Lateral: 3.4 cm

## 2021-08-20 DIAGNOSIS — Z125 Encounter for screening for malignant neoplasm of prostate: Secondary | ICD-10-CM | POA: Diagnosis not present

## 2021-08-21 ENCOUNTER — Encounter (HOSPITAL_COMMUNITY)
Admission: RE | Disposition: A | Discharge status: Home / Self Care | Payer: Self-pay | Source: Home / Self Care | Attending: Cardiovascular Disease

## 2021-08-21 ENCOUNTER — Ambulatory Visit (HOSPITAL_BASED_OUTPATIENT_CLINIC_OR_DEPARTMENT_OTHER): Payer: Medicare Other | Admitting: Anesthesiology

## 2021-08-21 ENCOUNTER — Ambulatory Visit (HOSPITAL_COMMUNITY)
Admission: RE | Admit: 2021-08-21 | Discharge: 2021-08-21 | Disposition: A | Discharge status: Home / Self Care | Payer: Medicare Other | Attending: Cardiovascular Disease | Admitting: Cardiovascular Disease

## 2021-08-21 ENCOUNTER — Other Ambulatory Visit: Payer: Self-pay

## 2021-08-21 ENCOUNTER — Ambulatory Visit (HOSPITAL_COMMUNITY): Payer: Medicare Other | Admitting: Anesthesiology

## 2021-08-21 ENCOUNTER — Encounter (HOSPITAL_COMMUNITY): Payer: Self-pay | Admitting: Cardiovascular Disease

## 2021-08-21 DIAGNOSIS — E785 Hyperlipidemia, unspecified: Secondary | ICD-10-CM | POA: Insufficient documentation

## 2021-08-21 DIAGNOSIS — I10 Essential (primary) hypertension: Secondary | ICD-10-CM | POA: Diagnosis not present

## 2021-08-21 DIAGNOSIS — I480 Paroxysmal atrial fibrillation: Secondary | ICD-10-CM | POA: Insufficient documentation

## 2021-08-21 DIAGNOSIS — Z7901 Long term (current) use of anticoagulants: Secondary | ICD-10-CM | POA: Insufficient documentation

## 2021-08-21 DIAGNOSIS — Z85820 Personal history of malignant melanoma of skin: Secondary | ICD-10-CM | POA: Diagnosis not present

## 2021-08-21 DIAGNOSIS — M1990 Unspecified osteoarthritis, unspecified site: Secondary | ICD-10-CM | POA: Diagnosis not present

## 2021-08-21 DIAGNOSIS — I4891 Unspecified atrial fibrillation: Secondary | ICD-10-CM | POA: Diagnosis not present

## 2021-08-21 HISTORY — PX: CARDIOVERSION: SHX1299

## 2021-08-21 SURGERY — CARDIOVERSION
Anesthesia: General

## 2021-08-21 MED ORDER — LIDOCAINE 2% (20 MG/ML) 5 ML SYRINGE
INTRAMUSCULAR | Status: DC | PRN
Start: 2021-08-21 — End: 2021-08-21
  Administered 2021-08-21: 50 mg via INTRAVENOUS

## 2021-08-21 MED ORDER — SODIUM CHLORIDE 0.9 % IV SOLN: INTRAVENOUS | Status: DC

## 2021-08-21 MED ORDER — PROPOFOL 10 MG/ML IV BOLUS
INTRAVENOUS | Status: DC | PRN
  Administered 2021-08-21: 50 mg via INTRAVENOUS

## 2021-08-21 NOTE — CV Procedure (Signed)
° ° °  Cardioversion Note  Lawrence Compton [id number] 1956-01-12  Procedure: DC Cardioversion Indications: atrial fib    Procedure Details Consent: Obtained Time Out: Verified patient identification, verified procedure, site/side was marked, verified correct patient position, special equipment/implants available, Radiology Safety Procedures followed,  medications/allergies/relevent history reviewed, required imaging and test results available.  Performed  The patient has been on adequate anticoagulation.  The patient received Lidocaine 50 mg IV followed by Propofol 50 mg IV  for sedation.  Synchronous cardioversion was performed at 200  joules.  The cardioversion was successful     Complications: No apparent complications Patient did tolerate procedure well.   Thayer Headings, Brooke Bonito., MD, [facility] 08/21/2021, 11:54 AM

## 2021-08-21 NOTE — Anesthesia Preprocedure Evaluation (Signed)
Anesthesia Evaluation    History of Anesthesia Complications (+) PONV and history of anesthetic complications  Airway Mallampati: II  TM Distance: >3 FB Neck ROM: Full    Dental no notable dental hx. (+) Teeth Intact   Pulmonary neg pulmonary ROS,    Pulmonary exam normal breath sounds clear to auscultation       Cardiovascular hypertension, Pt. on medications + dysrhythmias Atrial Fibrillation  Rhythm:Irregular Rate:Normal     Neuro/Psych negative neurological ROS  negative psych ROS   GI/Hepatic negative GI ROS, Neg liver ROS,   Endo/Other  Obesity  Renal/GU negative Renal ROS  negative genitourinary   Musculoskeletal  (+) Arthritis , Osteoarthritis,  Hx/o melanoma chest   Abdominal (+) + obese,   Peds  Hematology Eliquis therapy- last dose   Anesthesia Other Findings   Reproductive/Obstetrics                             Anesthesia Physical Anesthesia Plan  ASA: 3  Anesthesia Plan: General   Post-op Pain Management: Minimal or no pain anticipated   Induction: Intravenous  PONV Risk Score and Plan: 3 and Treatment may vary due to age or medical condition  Airway Management Planned: Mask and Natural Airway  Additional Equipment:   Intra-op Plan:   Post-operative Plan:   Informed Consent: I have reviewed the patients History and Physical, chart, labs and discussed the procedure including the risks, benefits and alternatives for the proposed anesthesia with the patient or authorized representative who has indicated his/her understanding and acceptance.     Dental advisory given  Plan Discussed with: CRNA and Anesthesiologist  Anesthesia Plan Comments:         Anesthesia Quick Evaluation

## 2021-08-21 NOTE — Anesthesia Postprocedure Evaluation (Signed)
Anesthesia Post Note  Patient: Lawrence Compton  Procedure(s) Performed: CARDIOVERSION     Patient location during evaluation: PACU Anesthesia Type: General Level of consciousness: awake and alert and oriented Pain management: pain level controlled Vital Signs Assessment: post-procedure vital signs reviewed and stable Respiratory status: spontaneous breathing, nonlabored ventilation and respiratory function stable Cardiovascular status: blood pressure returned to baseline and stable Postop Assessment: no apparent nausea or vomiting Anesthetic complications: no   No notable events documented.  Last Vitals:  Vitals:   08/21/21 1214 08/21/21 1223  BP: 133/89 132/84  Pulse: (!) 58 (!) 58  Resp: 14 17  Temp:    SpO2: 99% 99%    Last Pain:  Vitals:   08/21/21 1223  TempSrc:   PainSc: 0-No pain                 Britley Gashi A.

## 2021-08-21 NOTE — Transfer of Care (Signed)
Immediate Anesthesia Transfer of Care Note  Patient: Lawrence Compton  Procedure(s) Performed: CARDIOVERSION  Patient Location: Endoscopy Unit  Anesthesia Type:General  Level of Consciousness: awake  Airway & Oxygen Therapy: Patient Spontanous Breathing  Post-op Assessment: Report given to RN  Post vital signs: Reviewed and stable  Last Vitals:  Vitals Value Taken Time  BP 139/93   Temp    Pulse 56   Resp 15   SpO2 98     Last Pain:  Vitals:   08/21/21 1102  TempSrc: Temporal  PainSc: 0-No pain         Complications: No notable events documented.

## 2021-08-21 NOTE — Interval H&P Note (Signed)
History and Physical Interval Note:  08/21/2021 10:53 AM  Lawrence Compton  has presented today for surgery, with the diagnosis of ATRIAL FIB.  The various methods of treatment have been discussed with the patient and family. After consideration of risks, benefits and other options for treatment, the patient has consented to  Procedure(s): CARDIOVERSION (N/A) as a surgical intervention.  The patient's history has been reviewed, patient examined, no change in status, stable for surgery.  I have reviewed the patient's chart and labs.  Questions were answered to the patient's satisfaction.     Mertie Moores

## 2021-08-21 NOTE — Discharge Instructions (Signed)

## 2021-08-26 ENCOUNTER — Other Ambulatory Visit: Payer: Self-pay

## 2021-08-26 ENCOUNTER — Encounter: Payer: Self-pay | Admitting: Internal Medicine

## 2021-08-26 ENCOUNTER — Ambulatory Visit: Payer: Medicare Other | Admitting: Internal Medicine

## 2021-08-26 VITALS — BP 138/68 | HR 58 | Ht 68.0 in | Wt 206.4 lb

## 2021-08-26 DIAGNOSIS — I480 Paroxysmal atrial fibrillation: Secondary | ICD-10-CM | POA: Diagnosis not present

## 2021-08-26 NOTE — Progress Notes (Signed)
Cardiology Office Note:    Date:  08/26/2021   ID:  Lawrence Compton, DOB 1955-09-28, MRN [id number]  PCP:  Kristen Loader, FNP   [facility] Providers Cardiologist:  Janina Mayo, MD     Referring MD: No ref. provider found   No chief complaint on file. New Onset Atrial Fibrillation  History of Present Illness:   Initial HPI Lawrence Compton is a 66 y.o. male with a hx of htn, hld, melanoma on the chest wall s/p removal referral from [place] for new onset atrial fibrillation  He presents with atrial fibrillation. He was given eliquis samples. No palpitations. He denies syncope. He notes that his heart rates can get to the 40s.  No hx of lung disease. No signs of hx of sleep apnea. He has not had a sleep study.  He denies smoking history. His mother has atrial fibrillation and has challenges with RVR.  In terms of his past cardiac hx. He has no other hx of cardiac dx. He had chest pain  in 2009 and underwent stress test. He had ACS r/o prior in the ED.   He's on lisinopril- HCTz for HTN. Good control.  He is very active and works out consistently.    He does no have chest pain and sob on exertion. No LE swelling. No PND/orthopnea  07/18/2021 TSH- 1.75 LDL 113, TC 173 , HDL 42  Interim Hx: Underwent DDCV. Post procedure ECG shows sinus bradycardia with 1st degree AV block and septal Q waves (not new). He says he is doing well today. He denies any lightheadedness or syncope. He was asymptomatic prior. He did the kardiamobile in the office today which noted 'atrial fibrillation' Ekg shows sinus brady, p wave amplitude a bit low, has irregular narrow complex rhythm with PACs. Discussed this may not be a good monitoring tool for him.   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   Left Chest tx cx3 15fu   PONV (postoperative nausea and vomiting)     Past Surgical History:  Procedure Laterality Date   ACL implant     right knee    CARDIOVERSION N/A 08/21/2021   Procedure: CARDIOVERSION;  Surgeon: Thayer Headings, MD;  Location: [facility] ENDOSCOPY;  Service: Cardiovascular;  Laterality: N/A;   colonscopy      Excision of melanoma on chest wall  2013   TOTAL KNEE ARTHROPLASTY Right 05/29/2014   Procedure: RIGHT TOTAL KNEE ARTHROPLASTY WITH REMOVAL OF HARDWARE;  Surgeon: Mauri Pole, MD;  Location: [facility];  Service: Orthopedics;  Laterality: Right;    Current Medications: Current Meds  Medication Sig   apixaban (ELIQUIS) 5 MG TABS tablet Take 1 tablet (5 mg total) by mouth 2 (two) times daily.   diltiazem (CARDIZEM) 30 MG tablet Take 30 mg every 6 hours as needed for heart rate greater than 120   lisinopril-hydrochlorothiazide (ZESTORETIC) 20-12.5 MG tablet  Take 1 tablet by mouth daily.   Omega 3 1000 MG CAPS Take 1,000 mg by mouth daily.   tamsulosin (FLOMAX) 0.4 MG CAPS capsule Take 1 capsule (0.4 mg total) by mouth daily.     Allergies:   Patient has no known allergies.   Social History   Socioeconomic History   Marital status: Married    Spouse name: Not on file   Number of children: Not on file   Years of education: Not on file   Highest education level: Not on file  Occupational History   Not on file  Tobacco Use   Smoking status: Never   Smokeless tobacco: Never  Vaping Use   Vaping Use: Not on file  Substance and Sexual Activity   Alcohol use: Yes    Comment: occas    Drug use: No   Sexual activity: Not on file  Other Topics  Concern   Not on file  Social History Narrative   Not on file   Social Determinants of Health   Financial Resource Strain: Not on file  Food Insecurity: Not on file  Transportation Needs: Not on file  Physical Activity: Not on file  Stress: Not on file  Social Connections: Not on file     Family History: Mother- atrial fibrillation. Father- passed, died of cancer. No cardiac disease in siblings. HTN.  ROS:   Please see the history of present illness.    All other systems reviewed and are negative.  EKGs/Labs/Other Studies Reviewed:    The following studies were reviewed today:   EKG:  EKG is  ordered today.  The ekg ordered today demonstrates   08/01/2021-Afib rates 60s  HR 59 sinus brady, 1st degree AV block , PACs  Recent Labs: 08/01/2021: BUN 22; Creatinine, Ser 1.38; Hemoglobin 16.5; Platelets 188; Potassium 4.2; Sodium 140  Recent Lipid Panel    Component Value Date/Time   CHOL  03/09/2008 0710    169        ATP Compton CLASSIFICATION:  <200     mg/dL   Desirable  200-239  mg/dL   Borderline High  >=240    mg/dL   High   TRIG 159 (H) 03/09/2008 0710   HDL 31 (L) 03/09/2008 0710   CHOLHDL 5.5 03/09/2008 0710   VLDL 32 03/09/2008 0710   LDLCALC (H) 03/09/2008 0710    106        Total Cholesterol/HDL:CHD Risk Coronary Heart Disease Risk Table                     Men   Women  1/2 Average Risk   3.4   3.3     Risk Assessment/Calculations:    CHA2DS2-VASc Score = 2   This indicates a 2.2% annual risk of stroke. The patient's score is based upon: CHF History: 0 HTN History: 1 Diabetes History: 0 Stroke History: 0 Vascular Disease History: 0 Age Score: 1 Gender Score: 0            Physical Exam:    VS:   Vitals:   08/26/21 1429  BP: 138/68  Pulse: (!) 58  SpO2: 98%        BP 138/68    Pulse (!) 58    Ht 5\' 8"  (1.727 m)    Wt 206 lb 6.4 oz (93.6 kg)    SpO2 98%    BMI 31.38 kg/m     Wt Readings from Last 3 Encounters:  08/26/21 206  lb  6.4 oz (93.6 kg)  08/01/21 207 lb 12.8 oz (94.3 kg)  10/10/17 207 lb (93.9 kg)     GEN:  Well nourished, well developed in no acute distress HEENT: Normal NECK: No JVD; No carotid bruits LYMPHATICS: No lymphadenopathy CARDIAC: RRR, no murmurs, rubs, gallops RESPIRATORY:  Clear to auscultation without rales, wheezing or rhonchi  ABDOMEN: Soft, non-tender, non-distended MUSCULOSKELETAL:  No edema; No deformity  SKIN: Warm and dry NEUROLOGIC:  Alert and oriented x 3 PSYCHIATRIC:  Normal affect   ASSESSMENT:    #Paroxysmal Atrial Fibrillation: Chads2vasc =2. He is in atrial fibrillation today, rate controlled. Noted HR down to 40s on the initial visit hesitant to do continue BB/Ca channel blocker. Will continue dilt as needed.  He has no signs of CHF or CAD.  He is s/p DCCV 08/21/2021.  If he does not maintain sinus rhythm will refer him to EP/afib clinic for consideration of AAD/PVI. Can consider dofetilide ( 425 ms) or sotalol.  We discussed if he notes persistent high heart rates to let us know and we can send a monitor - continue eliquis - cont diltiazem PRN for HR > 120 bpm PLAN:    In order of problems listed above:  No changes Follow up 6 months      Shared Decision Making/Informed Consent The risks (stroke, cardiac arrhythmias rarely resulting in the need for a temporary or permanent pacemaker, skin irritation or burns and complications associated with conscious sedation including aspiration, arrhythmia, respiratory failure and death), benefits (restoration of normal sinus rhythm) and alternatives of a direct current cardioversion were explained in detail to Mr. Nicholson and he agrees to proceed.      Medication Adjustments/Labs and Tests Ordered: Current medicines are reviewed at length with the patient today.  Concerns regarding medicines are outlined above.  Orders Placed This Encounter  Procedures   EKG 12-Lead   No orders of the defined types were placed in this  encounter.   Patient Instructions  Medication Instructions:  No Changes In Medications at this time.  *If you need a refill on your cardiac medications before your next appointment, please call your pharmacy*  Follow-Up: At [facility], you and your health needs are our priority.  As part of our continuing mission to provide you with exceptional heart care, we have created designated Provider Care Teams.  These Care Teams include your primary Cardiologist (physician) and Advanced Practice Providers (APPs -  Physician Assistants and Nurse Practitioners) who all work together to provide you with the care you need, when you need it.  Your next appointment:   6 month(s)  The format for your next appointment:   In Person  Provider: Dr. Harl Bowie       Signed, Janina Mayo, MD  08/26/2021 3:05 PM    [facility]

## 2021-08-26 NOTE — Patient Instructions (Addendum)
Medication Instructions:  No Changes In Medications at this time.  *If you need a refill on your cardiac medications before your next appointment, please call your pharmacy*  Follow-Up: At [facility], you and your health needs are our priority.  As part of our continuing mission to provide you with exceptional heart care, we have created designated Provider Care Teams.  These Care Teams include your primary Cardiologist (physician) and Advanced Practice Providers (APPs -  Physician Assistants and Nurse Practitioners) who all work together to provide you with the care you need, when you need it.  Your next appointment:   6 month(s)  The format for your next appointment:   In Person  Provider: Dr. Harl Bowie

## 2021-08-27 DIAGNOSIS — R351 Nocturia: Secondary | ICD-10-CM | POA: Diagnosis not present

## 2021-08-27 DIAGNOSIS — Z87442 Personal history of urinary calculi: Secondary | ICD-10-CM | POA: Diagnosis not present

## 2021-08-27 DIAGNOSIS — Z125 Encounter for screening for malignant neoplasm of prostate: Secondary | ICD-10-CM | POA: Diagnosis not present

## 2021-08-29 ENCOUNTER — Encounter: Payer: Self-pay | Admitting: Dermatology

## 2021-08-29 NOTE — Progress Notes (Signed)
° °  Follow-Up Visit   Subjective  Lawrence Compton is a 66 y.o. male who presents for the following: Annual Exam (Pt here for annual exam. Pt has hx of melanoma. Pt has no other concerns ).  General skin examination, history of melanoma Location:  Duration:  Quality:  Associated Signs/Symptoms: Modifying Factors:  Severity:  Timing: Context:   Objective  Well appearing patient in no apparent distress; mood and affect are within normal limits. General skin examination, no signs skin cancer  No sign of recurrence, no new atypical pigmented lesions.  All pigmented lesions checked with dermoscopy.  Chest (Upper Torso, Anterior) Multiple 2 mm smooth red dermal papules  Left Temple Textured tan 6 mm flattopped papule, typical dermoscopy    A full examination was performed including scalp, head, eyes, ears, nose, lips, neck, chest, axillae, abdomen, back, buttocks, bilateral upper extremities, bilateral lower extremities, hands, feet, fingers, toes, fingernails, and toenails. All findings within normal limits unless otherwise noted below.   Assessment & Plan    Screening exam for skin cancer  Annual skin examination  Cherry angioma Chest (Upper Torso, Anterior)  No intervention indicated  Seborrheic keratosis Left Temple  Check as needed change  Personal history of malignant melanoma of skin  Encouraged patient to self examine twice annually.  Continued ultraviolet protection.      I, [name], MD, have reviewed all documentation for this visit.  The documentation on 08/29/21 for the exam, diagnosis, procedures, and orders are all accurate and complete.

## 2021-12-30 DIAGNOSIS — H43813 Vitreous degeneration, bilateral: Secondary | ICD-10-CM | POA: Diagnosis not present

## 2022-01-16 DIAGNOSIS — D6869 Other thrombophilia: Secondary | ICD-10-CM | POA: Diagnosis not present

## 2022-01-16 DIAGNOSIS — I10 Essential (primary) hypertension: Secondary | ICD-10-CM | POA: Diagnosis not present

## 2022-01-16 DIAGNOSIS — E785 Hyperlipidemia, unspecified: Secondary | ICD-10-CM | POA: Diagnosis not present

## 2022-01-16 DIAGNOSIS — H524 Presbyopia: Secondary | ICD-10-CM | POA: Diagnosis not present

## 2022-01-21 DIAGNOSIS — E785 Hyperlipidemia, unspecified: Secondary | ICD-10-CM | POA: Diagnosis not present

## 2022-01-21 DIAGNOSIS — I10 Essential (primary) hypertension: Secondary | ICD-10-CM | POA: Diagnosis not present

## 2022-02-05 ENCOUNTER — Telehealth: Payer: Self-pay | Admitting: Internal Medicine

## 2022-02-05 ENCOUNTER — Telehealth: Payer: Self-pay

## 2022-02-05 NOTE — Telephone Encounter (Signed)
Patient informed of appointment with A. Duke, PA-C for 8/10. Recommended that if symptoms worsen, patient is to go to the ED. He verbalized understanding.

## 2022-02-05 NOTE — Telephone Encounter (Signed)
Pt returning nurses Judson Roch) call. Please advise

## 2022-02-05 NOTE — Progress Notes (Unsigned)
Cardiology Office Note:    Date:  02/05/2022   ID:  [name], DOB 07/09/1955, MRN [id number]  PCP:  Kristen Loader, [place] Prov[facility] Cardiologist:  Janina Mayo, MD { Click to update primary MD,subspecialty MD or APP then REFRESH:1}    Referring MD: Kristen Loader, FNP   No chief complaint on file. ***  History of Present Illness:    Lawrence Compton is a 66 y.o. male with a hx of HTN and atrial fibrillation. He was referred to cardiology for new onset atrial fibrillation. He as anticoagulated and underwent DCCV 08/21/21 with successful conversion to sinus bradycardia with first degree heart block and septal Q waves (not new). Echo 08/15/21 with normal LVEF, mild-moderate MR and dilated LA. He was doing well in follow up and maintaining sinus rhythm.  He called our office 02/05/22 with symptoms concerning for Afib. He was added to my schedule.   Symptoms included PND and chest tightness.  Hx of nonischemic stress test in 2009.     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   Left Chest tx cx3 44f   PONV (postoperative nausea and vomiting)     Past Surgical History:  Procedure Laterality Date   ACL implant     right knee    CARDIOVERSION N/A 08/21/2021   Procedure: CARDIOVERSION;  Surgeon: NThayer Headings MD;  [facility];  Service: Cardiovascular;  Laterality: N/A;    colonscopy      Excision of melanoma on chest wall  2013   TOTAL KNEE ARTHROPLASTY Right 05/29/2014   Procedure: RIGHT TOTAL KNEE ARTHROPLASTY WITH REMOVAL OF HARDWARE;  Surgeon: MMauri Pole MD;  Location: [facility];  Service: Orthopedics;  Laterality: Right;    Current Medications: No outpatient medications have been marked as taking for the 02/06/22 encounter (Appointment) with [place]     Allergies:   Patient has no known allergies.   Social History   Socioeconomic History   Marital status: Married    Spouse name: Not on file   Number of children: Not on file   Years of education: Not on file   Highest education level: Not on file  Occupational History   Not on file  Tobacco Use   Smoking status: Never   Smokeless tobacco: Never  Vaping Use   Vaping Use: Not on file  Substance and Sexual Activity   Alcohol use: Yes    Comment: occas    Drug use: No   Sexual activity: Not on file  Other Topics Concern   Not on file  Social History Narrative   Not on file   Social Determinants of Health   Financial Resource Strain: Not on file  Food Insecurity: Not on file  Transportation Needs: Not on file  Physical Activity: Not on file  Stress: Not on file  Social Connections: Not on file     Family History: The patient's ***family history is not on file.  ROS:   Please see the history of present illness.    *** All other systems reviewed and are negative.  EKGs/Labs/Other Studies Reviewed:    The following studies were reviewed today:  Echo 08/15/21: 1. Left ventricular ejection fraction, by estimation, is 60 to 65%. Left  ventricular ejection fraction by 3D volume is 65 %. The left ventricle has  normal function. The left ventricle has no regional wall motion  abnormalities. There is mild asymmetric  left ventricular hypertrophy of the basal-septal segment. Left ventricular  diastolic function could not be evaluated.   2. Right ventricular  systolic function is normal. The right ventricular  size is normal. There is normal pulmonary artery systolic pressure. The  estimated right ventricular systolic pressure is 29.7 mmHg.   3. Left atrial size was moderately dilated.   4. The mitral valve is grossly normal. Mild to moderate mitral valve  regurgitation.   5. The aortic valve is tricuspid. Aortic valve regurgitation is not  visualized. No aortic stenosis is present.   6. Aortic dilatation noted. There is borderline dilatation of the aortic  root, measuring 38 mm.   7. The inferior vena cava is normal in size with greater than 50%  respiratory variability, suggesting right atrial pressure of 3 mmHg.  EKG:  EKG is *** ordered today.  The ekg ordered today demonstrates ***  Recent Labs: 08/01/2021: BUN 22; Creatinine, Ser 1.38; Hemoglobin 16.5; Platelets 188; Potassium 4.2; Sodium 140  Recent Lipid Panel    Component Value Date/Time   CHOL  03/09/2008 0710    169        ATP Compton CLASSIFICATION:  <200     mg/dL   Desirable  200-239  mg/dL   Borderline High  >=240    mg/dL   High   TRIG 159 (H) 03/09/2008 0710   HDL 31 (L) 03/09/2008 0710   CHOLHDL 5.5 03/09/2008 0710   VLDL 32 03/09/2008 0710   LDLCALC (H) 03/09/2008 0710    106        Total Cholesterol/HDL:CHD Risk Coronary Heart Disease Risk Table                     Men   Women  1/2 Average Risk   3.4   3.3     Risk Assessment/Calculations:   {Does this patient have ATRIAL FIBRILLATION?:[id number]}       Physical Exam:    VS:  There were no vitals taken for this visit.    Wt Readings from Last 3 Encounters:  08/26/21 206 lb 6.4 oz (93.6 kg)  08/01/21 207 lb 12.8 oz (94.3 kg)  10/10/17 207 lb (93.9 kg)     GEN: *** Well nourished, well developed in no acute distress HEENT: Normal NECK: No JVD; No carotid bruits LYMPHATICS: No lymphadenopathy CARDIAC: ***RRR, no murmurs, rubs, gallops RESPIRATORY:  Clear to auscultation without rales, wheezing or  rhonchi  ABDOMEN: Soft, non-tender, non-distended MUSCULOSKELETAL:  No edema; No deformity  SKIN: Warm and dry NEUROLOGIC:  Alert and oriented x 3 PSYCHIATRIC:  Normal affect   ASSESSMENT:    No diagnosis found. PLAN:    In order of problems listed above:  ***      {Are you ordering a CV Procedure (e.g. stress test, cath, DCCV, TEE, etc)?  Press F2        :[id number]}    Medication Adjustments/Labs and Tests Ordered: Current medicines are reviewed at length with the patient today.  Concerns regarding medicines are outlined above.  No orders of the defined types were placed in this encounter.  No orders of the defined types were placed in this encounter.   There are no Patient Instructions on file for this visit.   Signed, Santel, PA  02/05/2022 10:10 PM    [facility]

## 2022-02-05 NOTE — Telephone Encounter (Signed)
  Per MyChart scheduling message:  I feel like I may be experiencing Afib again. Sleeping has been an issue because I feel like I'm not getting enough oxygen when I breathe. My heart rate at night is not racing....if anything it's low (40's). Do I wait for the appt on the 28th?   Patient c/o Palpitations:  High priority if patient c/o lightheadedness, shortness of breath, or chest pain  How long have you had palpitations/irregular HR/ Afib? Are you having the symptoms now?   Are you currently experiencing lightheadedness, SOB or CP?   Do you have a history of afib (atrial fibrillation) or irregular heart rhythm?   Have you checked your BP or HR? (document readings if available):   Are you experiencing any other symptoms?    1. I have felt breathlessness intermittently for about a week. Last night I was unable to sleep all night because of breathlessness. 2 SOB has continued today. 3 Afib was diagnosed in January, 2023 and a cardioversion performed in February. 4 BP at 6:30 this morning was 139/84, HR was 40 bpm. 5 I have not experienced a high heart rate...mainly SOB. Last night was the worse I've experienced.

## 2022-02-05 NOTE — Telephone Encounter (Signed)
Patient reports PND from 10 pm to 5 am this morning with chest tightness. BP this am was 139/84, P 40. He believes he might be in afib again. Was not sob on phone. Denies dizziness/lightheadedness. Not feeling normal and had to leave work today. Dr. Harl Bowie advised and wants patient to have an acute visit or if symptoms worsen, go to the ED. Left message for patient to call back.

## 2022-02-06 ENCOUNTER — Ambulatory Visit (INDEPENDENT_AMBULATORY_CARE_PROVIDER_SITE_OTHER): Payer: Medicare Other

## 2022-02-06 ENCOUNTER — Encounter: Payer: Self-pay | Admitting: Physician Assistant

## 2022-02-06 ENCOUNTER — Ambulatory Visit: Payer: Medicare Other | Admitting: Physician Assistant

## 2022-02-06 VITALS — BP 134/86 | HR 40 | Ht 68.0 in | Wt 206.0 lb

## 2022-02-06 DIAGNOSIS — I480 Paroxysmal atrial fibrillation: Secondary | ICD-10-CM

## 2022-02-06 DIAGNOSIS — Z7901 Long term (current) use of anticoagulants: Secondary | ICD-10-CM | POA: Diagnosis not present

## 2022-02-06 DIAGNOSIS — R0602 Shortness of breath: Secondary | ICD-10-CM

## 2022-02-06 DIAGNOSIS — I10 Essential (primary) hypertension: Secondary | ICD-10-CM | POA: Diagnosis not present

## 2022-02-06 NOTE — Progress Notes (Unsigned)
Enrolled for [employer] to mail a ZIO XT long term holter monitor to the patients address on file.   Dr. Camnitz to read. 

## 2022-02-06 NOTE — Patient Instructions (Signed)
Medication Instructions:  No Changes *If you need a refill on your cardiac medications before your next appointment, please call your pharmacy*   Lab Work: CMET,Magnesium,TSH If you have labs (blood work) drawn today and your tests are completely normal, you will receive your results only by: [place] (if you have MyChart) OR A paper copy in the mail If you have any lab test that is abnormal or we need to change your treatment, we will call you to review the results.   Testing/Procedures: [place]- Long Term Monitor Instructions  Your physician has requested you wear a ZIO patch monitor for 14 days.  This is a single patch monitor. [employer] supplies one patch monitor per enrollment. Additional stickers are not available. Please do not apply patch if you will be having a Nuclear Stress Test,  Echocardiogram, Cardiac CT, MRI, or Chest Xray during the period you would be wearing the  monitor. The patch cannot be worn during these tests. You cannot remove and re-apply the  ZIO XT patch monitor.  Your ZIO patch monitor will be mailed 3 day USPS to your address on file. It may take 3-5 days  to receive your monitor after you have been enrolled.  Once you have received your monitor, please review the enclosed instructions. Your monitor  has already been registered assigning a specific monitor serial # to you.  Billing and Patient Assistance Program Information  We have supplied [employer] with any of your insurance information on file for billing purposes. [employer] offers a sliding scale Patient Assistance Program for patients that do not have  insurance, or whose insurance does not completely cover the cost of the ZIO monitor.  You must apply for the Patient Assistance Program to qualify for this discounted rate.  To apply, please call [employer] at [phone], select option 4, select option 2, ask to apply for  Patient Assistance Program. Theodore Demark will ask your household income, and how  many people  are in your household. They will quote your out-of-pocket cost based on that information.  [employer] will also be able to set up a 71-month interest-free payment plan if needed.  Applying the monitor   Shave hair from upper left chest.  Hold abrader disc by orange tab. Rub abrader in 40 strokes over the upper left chest as  indicated in your monitor instructions.  Clean area with 4 enclosed alcohol pads. Let dry.  Apply patch as indicated in monitor instructions. Patch will be placed under collarbone on left  side of chest with arrow pointing upward.  Rub patch adhesive wings for 2 minutes. Remove white label marked "1". Remove the white  label marked "2". Rub patch adhesive wings for 2 additional minutes.  While looking in a mirror, press and release button in center of patch. A small green light will  flash 3-4 times. This will be your only indicator that the monitor has been turned on.  Do not shower for the first 24 hours. You may shower after the first 24 hours.  Press the button if you feel a symptom. You will hear a small click. Record Date, Time and  Symptom in the Patient Logbook.  When you are ready to remove the patch, follow instructions on the last 2 pages of Patient  Logbook. Stick patch monitor onto the last page of Patient Logbook.  Place Patient Logbook in the blue and white box. Use locking tab on box and tape box closed  securely. The blue and white box has  prepaid postage on it. Please place it in the mailbox as  soon as possible. Your physician should have your test results approximately 7 days after the  monitor has been mailed back to [facility].  Call [place] at [phone] if you have questions regarding  your ZIO XT patch monitor. Call them immediately if you see an orange light blinking on your  monitor.  If your monitor falls off in less than 4 days, contact our Monitor department at [phone].  If your monitor becomes  loose or falls off after 4 days call [employer] at [phone] for  suggestions on securing your monitor    Follow-Up: At [facility], you and your health needs are our priority.  As part of our continuing mission to provide you with exceptional heart care, we have created designated Provider Care Teams.  These Care Teams include your primary Cardiologist (physician) and Advanced Practice Providers (APPs -  Physician Assistants and Nurse Practitioners) who all work together to provide you with the care you need, when you need it.  We recommend signing up for the patient portal called "MyChart".  Sign up information is provided on this After Visit Summary.  MyChart is used to connect with patients for Virtual Visits (Telemedicine).  Patients are able to view lab/test results, encounter notes, upcoming appointments, etc.  Non-urgent messages can be sent to your provider as well.   To learn more about what you can do with MyChart, go to [email or web address].    Your next appointment:   Keep Scheduled Appointment  The format for your next appointment:   In Person  Provider:   Ocie Cornfield, MD

## 2022-02-06 NOTE — Telephone Encounter (Signed)
Please see other telephone encounter. Patient scheduled to see Fabian Sharp PA-C today 8/10.

## 2022-02-07 LAB — COMPREHENSIVE METABOLIC PANEL
ALT: 22 IU/L (ref 0–44)
AST: 17 IU/L (ref 0–40)
Albumin/Globulin Ratio: 2 (ref 1.2–2.2)
Albumin: 4.6 g/dL (ref 3.9–4.9)
Alkaline Phosphatase: 48 IU/L (ref 44–121)
BUN/Creatinine Ratio: 16 (ref 10–24)
BUN: 19 mg/dL (ref 8–27)
Bilirubin Total: 0.6 mg/dL (ref 0.0–1.2)
CO2: 24 mmol/L (ref 20–29)
Calcium: 10.2 mg/dL (ref 8.6–10.2)
Chloride: 103 mmol/L (ref 96–106)
Creatinine, Ser: 1.22 mg/dL (ref 0.76–1.27)
Globulin, Total: 2.3 g/dL (ref 1.5–4.5)
Glucose: 78 mg/dL (ref 70–99)
Potassium: 4.8 mmol/L (ref 3.5–5.2)
Sodium: 145 mmol/L — ABNORMAL HIGH (ref 134–144)
Total Protein: 6.9 g/dL (ref 6.0–8.5)
eGFR: 65 mL/min/{1.73_m2} (ref >59)

## 2022-02-07 LAB — TSH: TSH: 2.37 u[IU]/mL (ref 0.450–4.500)

## 2022-02-07 LAB — MAGNESIUM: Magnesium: 2.3 mg/dL (ref 1.6–2.3)

## 2022-02-11 DIAGNOSIS — I480 Paroxysmal atrial fibrillation: Secondary | ICD-10-CM

## 2022-02-24 ENCOUNTER — Ambulatory Visit: Payer: Medicare Other | Admitting: Internal Medicine

## 2022-03-07 ENCOUNTER — Telehealth: Payer: Self-pay | Admitting: Internal Medicine

## 2022-03-07 DIAGNOSIS — I480 Paroxysmal atrial fibrillation: Secondary | ICD-10-CM | POA: Diagnosis not present

## 2022-03-07 NOTE — Telephone Encounter (Signed)
[employer] calling to give STAT Results.

## 2022-03-07 NOTE — Telephone Encounter (Signed)
   Cardiac Monitor Alert  Date of alert:  03/07/2022   Patient Name: Lawrence Compton  DOB: 1955-08-27  MRN: [id number]   [facility] Cardiologist: Janina Mayo, MD  [facility] EP:  None    Monitor Information: Long Term Monitor [ZioXT]  Reason:  atrial fibrillation  Ordering provider:  Ledora Bottcher   Alert Bradycardia - slowest HR: 34 This is the 1st alert for this rhythm.   Next Cardiology Appointment   Date:  9/1/20239  Provider:  Dr. Curt Bears   The patient could NOT be reached by telephone today.      Plan:  awaiting fax copy   Other:   Tor Netters, RN  03/07/2022 4:17 PM

## 2022-03-18 ENCOUNTER — Encounter: Payer: Self-pay | Admitting: *Deleted

## 2022-03-18 ENCOUNTER — Encounter: Payer: Self-pay | Admitting: Cardiology

## 2022-03-18 ENCOUNTER — Ambulatory Visit: Payer: Medicare Other | Attending: Cardiology | Admitting: Cardiology

## 2022-03-18 VITALS — BP 104/68 | HR 68 | Ht 68.0 in | Wt 206.4 lb

## 2022-03-18 DIAGNOSIS — I480 Paroxysmal atrial fibrillation: Secondary | ICD-10-CM

## 2022-03-18 DIAGNOSIS — I4819 Other persistent atrial fibrillation: Secondary | ICD-10-CM

## 2022-03-18 DIAGNOSIS — D6869 Other thrombophilia: Secondary | ICD-10-CM

## 2022-03-18 DIAGNOSIS — I10 Essential (primary) hypertension: Secondary | ICD-10-CM

## 2022-03-18 NOTE — Patient Instructions (Addendum)
Medication Instructions:  Your physician recommends that you continue on your current medications as directed. Please refer to the Current Medication list given to you today.  *If you need a refill on your cardiac medications before your next appointment, please call your pharmacy*   Lab Work: Pre procedure labs -- see procedure instruction letter:  BMP & CBC  If you have labs (blood work) drawn today and your tests are completely normal, you will receive your results only by: [place] (if you have MyChart) OR A paper copy in the mail If you have any lab test that is abnormal or we need to change your treatment, we will call you to review the results.   Testing/Procedures: Your physician has requested that you have cardiac CT within 7 days PRIOR to your ablation. Cardiac computed tomography (CT) is a painless test that uses an x-ray machine to take clear, detailed pictures of your heart.  Please follow instruction below located under "other instructions". You will get a call from our office to schedule the date for this test.  Your physician has recommended that you have an ablation. Catheter ablation is a medical procedure used to treat some cardiac arrhythmias (irregular heartbeats). During catheter ablation, a long, thin, flexible tube is put into a blood vessel in your groin (upper thigh), or neck. This tube is called an ablation catheter. It is then guided to your heart through the blood vessel. Radio frequency waves destroy small areas of heart tissue where abnormal heartbeats may cause an arrhythmia to start. Please follow instruction letter given to you today.   Follow-Up: At [facility], you and your health needs are our priority.  As part of our continuing mission to provide you with exceptional heart care, we have created designated Provider Care Teams.  These Care Teams include your primary Cardiologist (physician) and Advanced Practice Providers (APPs -  Physician  Assistants and Nurse Practitioners) who all work together to provide you with the care you need, when you need it.  Your physician recommends that you schedule a follow-up appointment in the AFib clinic between 06/13/22 - 06/27/22   After Ablation: Your next appointment:   1 month(s) after your ablation  The format for your next appointment:   In Person  Provider:   AFib clinic   Thank you for choosing [facility]!!   Trinidad Curet, RN [phone]    Other Instructions   Cardiac Ablation Cardiac ablation is a procedure to destroy (ablate) some heart tissue that is sending bad signals. These bad signals cause problems in heart rhythm. The heart has many areas that make these signals. If there are problems in these areas, they can make the heart beat in a way that is not normal. Destroying some tissues can help make the heart rhythm normal. Tell your doctor about: Any allergies you have. All medicines you are taking. These include vitamins, herbs, eye drops, creams, and over-the-counter medicines. Any problems you or family members have had with medicines that make you fall asleep (anesthetics). Any blood disorders you have. Any surgeries you have had. Any medical conditions you have, such as kidney failure. Whether you are pregnant or may be pregnant. What are the risks? This is a safe procedure. But problems may occur, including: Infection. Bruising and bleeding. Bleeding into the chest. Stroke or blood clots. Damage to nearby areas of your body. Allergies to medicines or dyes. The need for a pacemaker if the normal system is damaged. Failure of the procedure  to treat the problem. What happens before the procedure? Medicines Ask your doctor about: Changing or stopping your normal medicines. This is important. Taking aspirin and ibuprofen. Do not take these medicines unless your doctor tells you to take them. Taking other medicines, vitamins, herbs, and  supplements. General instructions Follow instructions from your doctor about what you cannot eat or drink. Plan to have someone take you home from the hospital or clinic. If you will be going home right after the procedure, plan to have someone with you for 24 hours. Ask your doctor what steps will be taken to prevent infection. What happens during the procedure?  An IV tube will be put into one of your veins. You will be given a medicine to help you relax. The skin on your neck or groin will be numbed. A cut (incision) will be made in your neck or groin. A needle will be put through your cut and into a large vein. A tube (catheter) will be put into the needle. The tube will be moved to your heart. Dye may be put through the tube. This helps your doctor see your heart. Small devices (electrodes) on the tube will send out signals. A type of energy will be used to destroy some heart tissue. The tube will be taken out. Pressure will be held on your cut. This helps stop bleeding. A bandage will be put over your cut. The exact procedure may vary among doctors and hospitals. What happens after the procedure? You will be watched until you leave the hospital or clinic. This includes checking your heart rate, breathing rate, oxygen, and blood pressure. Your cut will be watched for bleeding. You will need to lie still for a few hours. Do not drive for 24 hours or as long as your doctor tells you. Summary Cardiac ablation is a procedure to destroy some heart tissue. This is done to treat heart rhythm problems. Tell your doctor about any medical conditions you may have. Tell him or her about all medicines you are taking to treat them. This is a safe procedure. But problems may occur. These include infection, bruising, bleeding, and damage to nearby areas of your body. Follow what your doctor tells you about food and drink. You may also be told to change or stop some of your medicines. After the  procedure, do not drive for 24 hours or as long as your doctor tells you. This information is not intended to replace advice given to you by your health care provider. Make sure you discuss any questions you have with your health care provider. Document Revised: 09/06/2021 Document Reviewed: 05/19/2019 Elsevier Patient Education  [place].

## 2022-03-18 NOTE — Progress Notes (Signed)
Electrophysiology Office Note   Date:  03/18/2022   ID:  Lawrence Compton, DOB [birth year], MRN [id number]  PCP:  Kristen Loader, FNP  Cardiologist:  Phineas Inches Primary Electrophysiologist:  Dervin Vore Meredith Leeds, MD    Chief Complaint: AF   History of Present Illness: Lawrence Compton is a 66 y.o. male who is being seen today for the evaluation of AF at the request of Kristen Loader, FNP. Presenting today for electrophysiology evaluation.  He has a history significant for hypertension and atrial fibrillation.  He had a cardioversion 08/21/2021.  He had sinus bradycardia with first-degree AV block and septal Q waves post cardioversion.  Echo showed a normal ejection fraction.  He came back to cardiology clinic with symptoms of atrial fibrillation.  He has shortness of breath and fatigue.  Spite his atrial fibrillation, he is able to exercise.  He plays pickle ball every other day and goes to the gym.  He states that at times he is woken up in the middle the night feeling short of breath that he has attributed to his atrial fibrillation.  Today, he denies symptoms of palpitations, chest pain, shortness of breath, orthopnea, PND, lower extremity edema, claudication, dizziness, presyncope, syncope, bleeding, or neurologic sequela. The patient is tolerating medications without difficulties.    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   Left Chest tx cx3 47f   PONV (postoperative nausea and vomiting)    Past Surgical History:  Procedure Laterality Date   ACL implant     right knee    CARDIOVERSION N/A 08/21/2021   Procedure: CARDIOVERSION;   Surgeon: NThayer Headings MD;  [facility]  Service: Cardiovascular;  Laterality: N/A;   colonscopy      Excision of melanoma on chest wall  2013   TOTAL KNEE ARTHROPLASTY Right 05/29/2014   Procedure: RIGHT TOTAL KNEE ARTHROPLASTY WITH REMOVAL OF HARDWARE;  Surgeon: MMauri Pole MD;  [facility]: [facility];  Service: Orthopedics;  Laterality: Right;     Current Outpatient Medications  Medication Sig Dispense Refill   apixaban (ELIQUIS) 5 MG TABS tablet Take 1 tablet (5 mg total) by mouth 2 (two) times daily. 180 tablet 3   diltiazem (CARDIZEM) 30 MG tablet Take 30 mg every 6 hours as needed for heart rate greater than 120 60 tablet 6   lisinopril-hydrochlorothiazide (ZESTORETIC) 20-12.5 MG tablet Take 1 tablet by mouth daily.     Omega 3 1000 MG CAPS Take 1,000 mg by mouth daily.     tamsulosin (FLOMAX) 0.4 MG CAPS capsule Take 1 capsule (0.4 mg total) by mouth daily. 30 capsule 0   No current facility-administered medications for this visit.    Allergies:   Patient has no known allergies.   Social History:  The patient  reports that he has never smoked. He has never used smokeless tobacco. He reports current alcohol use. He reports that he does not use drugs.   Family History:  The patient's family history includes Atrial fibrillation in his mother; Hypertension in his father and  mother.    ROS:  Please see the history of present illness.   Otherwise, review of systems is positive for none.   All other systems are reviewed and negative.    PHYSICAL EXAM: VS:  BP 104/68   Pulse 68   Ht '5\' 8"'$  (1.727 m)   Wt 206 lb 6.4 oz (93.6 kg)   SpO2 96%   BMI 31.38 kg/m  , BMI Body mass index is 31.38 kg/m. GEN: Well nourished, well developed, in no acute distress  HEENT: normal  Neck: no JVD, carotid bruits, or masses Cardiac: irregular; no murmurs, rubs, or gallops,no edema  Respiratory:  clear to auscultation bilaterally, normal work of breathing GI: soft, nontender,  nondistended, + BS MS: no deformity or atrophy  Skin: warm and dry Neuro:  Strength and sensation are intact Psych: euthymic mood, full affect  EKG:  EKG is not ordered today. Personal review of the ekg ordered 02/06/22 shows atrial fibrillation, rate 46  Recent Labs: 08/01/2021: Hemoglobin 16.5; Platelets 188 02/06/2022: ALT 22; BUN 19; Creatinine, Ser 1.22; Magnesium 2.3; Potassium 4.8; Sodium 145; TSH 2.370    Lipid Panel     Component Value Date/Time   CHOL  03/09/2008 0710    169        ATP Compton CLASSIFICATION:  <200     mg/dL   Desirable  200-239  mg/dL   Borderline High  >=240    mg/dL   High   TRIG 159 (H) 03/09/2008 0710   HDL 31 (L) 03/09/2008 0710   CHOLHDL 5.5 03/09/2008 0710   VLDL 32 03/09/2008 0710   LDLCALC (H) 03/09/2008 0710    106        Total Cholesterol/HDL:CHD Risk Coronary Heart Disease Risk Table                     Men   Women  1/2 Average Risk   3.4   3.3     Wt Readings from Last 3 Encounters:  03/18/22 206 lb 6.4 oz (93.6 kg)  02/06/22 206 lb (93.4 kg)  08/26/21 206 lb 6.4 oz (93.6 kg)      Other studies Reviewed: Additional studies/ records that were reviewed today include: TTE 08/15/21  Review of the above records today demonstrates:   1. Left ventricular ejection fraction, by estimation, is 60 to 65%. Left  ventricular ejection fraction by 3D volume is 65 %. The left ventricle has  normal function. The left ventricle has no regional wall motion  abnormalities. There is mild asymmetric  left ventricular hypertrophy of the basal-septal segment. Left ventricular  diastolic function could not be evaluated.   2. Right ventricular systolic function is normal. The right ventricular  size is normal. There is normal pulmonary artery systolic pressure. The  estimated right ventricular systolic pressure is 51.7 mmHg.   3. Left atrial size was moderately dilated.   4. The mitral valve is grossly normal. Mild to moderate mitral valve   regurgitation.   5. The aortic valve is tricuspid. Aortic valve regurgitation is not  visualized. No aortic stenosis is present.   6. Aortic dilatation noted. There is borderline dilatation of the aortic  root, measuring 38 mm.   7. The inferior vena cava is normal in size with greater than 50%  respiratory variability, suggesting right atrial pressure of 3 mmHg.   Cardiac monitor 03/10/2022 personally reviewed 100% atrial fibrillation burden 7 VT episodes, longest 16 beats at 144 bpm Less than 1%  ventricular ectopy 142 pauses, longest 3.7 seconds Triggered episode associated with atrial fibrillation   Max 250 bpm 10:03am, 08/26 Min 29 bpm 04:48am, 08/18 Avg 58 bpm  ASSESSMENT AND PLAN:  1.  Persistent atrial fibrillation: CHA2DS2-VASc of 2.  Currently on Eliquis 5 mg twice daily.  He has potentially some mild fatigue and shortness of breath.  We discussed rhythm control.  At this point, he would prefer to avoid medications.  Due to that, we Aunisty Reali plan for ablation.  Risk, benefits, and alternatives to EP study and radiofrequency ablation for afib were also discussed in detail today. These risks include but are not limited to stroke, bleeding, vascular damage, tamponade, perforation, damage to the esophagus, lungs, and other structures, pulmonary vein stenosis, worsening renal function, and death. The patient understands these risk and wishes to proceed.  We Shalisa Mcquade therefore proceed with catheter ablation at the next available time.  Carto, ICE, anesthesia are requested for the procedure.  Sunya Humbarger also obtain CT PV protocol prior to the procedure to exclude LAA thrombus and further evaluate atrial anatomy.   2.  Hypertension: Continue Zestoretic per primary physician.  3.  Secondary hypercoagulable state: Currently on Eliquis for atrial fibrillation as above  Current medicines are reviewed at length with the patient today.   The patient does not have concerns regarding his medicines.  The  following changes were made today:  none  Labs/ tests ordered today include:  Orders Placed This Encounter  Procedures   CT CARDIAC MORPH/PULM VEIN W/CM&W/O CA SCORE     Disposition:   FU with Forrestine Lecrone 3 months  Signed, Rennee Coyne Meredith Leeds, MD  03/18/2022 8:54 AM     [address] [place] [address] [phone] (office) [phone] (fax)

## 2022-06-17 ENCOUNTER — Ambulatory Visit (HOSPITAL_COMMUNITY): Payer: Medicare Other | Admitting: Nurse Practitioner

## 2022-06-20 ENCOUNTER — Ambulatory Visit (HOSPITAL_COMMUNITY)
Admission: RE | Admit: 2022-06-20 | Discharge: 2022-06-20 | Disposition: A | Discharge status: Home / Self Care | Payer: Medicare Other | Source: Ambulatory Visit | Attending: Nurse Practitioner | Admitting: Nurse Practitioner

## 2022-06-20 ENCOUNTER — Encounter (HOSPITAL_COMMUNITY): Payer: Self-pay | Admitting: Nurse Practitioner

## 2022-06-20 VITALS — BP 138/86 | HR 67 | Ht 68.0 in | Wt 207.2 lb

## 2022-06-20 DIAGNOSIS — D6869 Other thrombophilia: Secondary | ICD-10-CM

## 2022-06-20 DIAGNOSIS — I480 Paroxysmal atrial fibrillation: Secondary | ICD-10-CM

## 2022-06-20 DIAGNOSIS — I4819 Other persistent atrial fibrillation: Secondary | ICD-10-CM | POA: Insufficient documentation

## 2022-06-20 DIAGNOSIS — Z7901 Long term (current) use of anticoagulants: Secondary | ICD-10-CM | POA: Insufficient documentation

## 2022-06-20 DIAGNOSIS — Z8249 Family history of ischemic heart disease and other diseases of the circulatory system: Secondary | ICD-10-CM | POA: Diagnosis not present

## 2022-06-20 LAB — BASIC METABOLIC PANEL
Anion gap: 8 (ref 5–15)
BUN: 25 mg/dL — ABNORMAL HIGH (ref 8–23)
CO2: 24 mmol/L (ref 22–32)
Calcium: 9.3 mg/dL (ref 8.9–10.3)
Chloride: 109 mmol/L (ref 98–111)
Creatinine, Ser: 1.33 mg/dL — ABNORMAL HIGH (ref 0.61–1.24)
GFR, Estimated: 59 mL/min — ABNORMAL LOW (ref >60)
Glucose, Bld: 104 mg/dL — ABNORMAL HIGH (ref 70–99)
Potassium: 3.9 mmol/L (ref 3.5–5.1)
Sodium: 141 mmol/L (ref 135–145)

## 2022-06-20 LAB — CBC
HCT: 45.5 % (ref 39.0–52.0)
Hemoglobin: 16.3 g/dL (ref 13.0–17.0)
MCH: 30.8 pg (ref 26.0–34.0)
MCHC: 35.8 g/dL (ref 30.0–36.0)
MCV: 85.8 fL (ref 80.0–100.0)
Platelets: 202 10*3/uL (ref 150–400)
RBC: 5.3 MIL/uL (ref 4.22–5.81)
RDW: 13 % (ref 11.5–15.5)
WBC: 7.9 10*3/uL (ref 4.0–10.5)
nRBC: 0 % (ref 0.0–0.2)

## 2022-06-20 NOTE — H&P (View-Only) (Signed)
Primary Care Physician: Kristen Loader, FNP Referring Physician: Dr. Wille Glaser III is a 66 y.o. male with a h/o afib with CVR that saw Dr. Curt Bears in September and was scheduled for an ablation pending 07/11/21. He reports he continues to be highly asymptomatic and well functioning with his afib. He usually will play pickle ball in the am and then go to the gym in the afternoons. He does notice a slow heart beat at times,when at rest,  non sustained in the 30's- 40's that will increase back to  normal range with activity.   His health is unchanged since he saw Dr. Curt Bears last and no recent illness. Weight is stable, he is not prone to retain fluid.   Today, he denies symptoms of palpitations, chest pain, shortness of breath, orthopnea, PND, lower extremity edema, dizziness, presyncope, syncope, or neurologic sequela. The patient is tolerating medications without difficulties and is otherwise without complaint today.   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   Left Chest tx cx3 67f   PONV (postoperative nausea and vomiting)    Past Surgical History:  Procedure Laterality Date   ACL implant     right knee    CARDIOVERSION N/A 08/21/2021   Procedure: CARDIOVERSION;  Surgeon: NThayer Headings MD;  [facility];  Service: Cardiovascular;  Laterality: N/A;   colonscopy      Excision of melanoma on chest wall  2013   TOTAL KNEE ARTHROPLASTY Right 05/29/2014   Procedure: RIGHT TOTAL KNEE ARTHROPLASTY WITH REMOVAL OF HARDWARE;  Surgeon: MMauri Pole MD;  [facility];  Service: Orthopedics;  Laterality: Right;     Current Outpatient Medications  Medication Sig Dispense Refill   apixaban (ELIQUIS) 5 MG TABS tablet Take 1 tablet (5 mg total) by mouth 2 (two) times daily. 180 tablet 3   diltiazem (CARDIZEM) 30 MG tablet Take 30 mg every 6 hours as needed for heart rate greater than 120 60 tablet 6   lisinopril-hydrochlorothiazide (ZESTORETIC) 20-12.5 MG tablet Take 1 tablet by mouth daily.     Omega 3 1000 MG CAPS Take 1,000 mg by mouth daily.     tamsulosin (FLOMAX) 0.4 MG CAPS capsule Take 1 capsule (0.4 mg total) by mouth daily. 30 capsule 0   No current facility-administered medications for this encounter.    No Known Allergies  Social History   Socioeconomic History   Marital status: Married    Spouse name: Not on file   Number of children: Not on file   Years of education: Not on file   Highest education level: Not on file  Occupational History   Not on file  Tobacco Use   Smoking status: Never   Smokeless tobacco: Never  Vaping Use   Vaping Use: Not on file  Substance and Sexual Activity   Alcohol use: Yes    Comment: occas    Drug use: No   Sexual activity: Not on file  Other Topics Concern   Not on file  Social History Narrative  Not on file   Social Determinants of Health   Financial Resource Strain: Not on file  Food Insecurity: Not on file  Transportation Needs: Not on file  Physical Activity: Not on file  Stress: Not on file  Social Connections: Not on file  Intimate Partner Violence: Not on file    Family History  Problem Relation Age of Onset   Hypertension Mother    Atrial fibrillation Mother    Hypertension Father     ROS- All systems are reviewed and negative except as per the HPI above  Physical Exam: Vitals:   06/20/22 1430  BP: 138/86  Pulse: 67  Weight: 94 kg  Height: '5\' 8"'$  (1.727 m)   Wt Readings from Last 3 Encounters:  06/20/22 94 kg  03/18/22 93.6 kg  02/06/22 93.4 kg    Labs: Lab Results  Component Value Date   NA 145 (H)  02/06/2022   K 4.8 02/06/2022   CL 103 02/06/2022   CO2 24 02/06/2022   GLUCOSE 78 02/06/2022   BUN 19 02/06/2022   CREATININE 1.22 02/06/2022   CALCIUM 10.2 02/06/2022   MG 2.3 02/06/2022   Lab Results  Component Value Date   INR 1.02 05/22/2014   Lab Results  Component Value Date   CHOL  03/09/2008    169        ATP III CLASSIFICATION:  <200     mg/dL   Desirable  200-239  mg/dL   Borderline High  >=240    mg/dL   High   HDL 31 (L) 03/09/2008   LDLCALC (H) 03/09/2008    106        Total Cholesterol/HDL:CHD Risk Coronary Heart Disease Risk Table                     Men   Women  1/2 Average Risk   3.4   3.3   TRIG 159 (H) 03/09/2008     GEN- The patient is well appearing, alert and oriented x 3 today.   Head- normocephalic, atraumatic Eyes-  Sclera clear, conjunctiva pink Ears- hearing intact Oropharynx- clear Neck- supple, no JVP Lymph- no cervical lymphadenopathy Lungs- Clear to ausculation bilaterally, normal work of breathing Heart-irregular rate and rhythm, no murmurs, rubs or gallops, PMI not laterally displaced GI- soft, NT, ND, + BS Extremities- no clubbing, cyanosis, or edema MS- no significant deformity or atrophy Skin- no rash or lesion Psych- euthymic mood, full affect Neuro- strength and sensation are intact  EKG-Vent. rate 67 BPM PR interval * ms QRS duration 70 ms QT/QTcB 386/407 ms P-R-T axes * 171 54  Suspect arm lead reversal, interpretation assumes no reversal Atrial fibrillation Septal infarct , age undetermined Lateral infarct , age undetermined Abnormal ECG When compared with ECG of 21-Aug-2021 12:02, PREVIOUS ECG IS PRESENT  Echo-1. Left ventricular ejection fraction, by estimation, is 60 to 65%. Left  ventricular ejection fraction by 3D volume is 65 %. The left ventricle has  normal function. The left ventricle has no regional wall motion  abnormalities. There is mild asymmetric  left ventricular hypertrophy of the basal-septal  segment. Left ventricular  diastolic function could not be evaluated.   2. Right ventricular systolic function is normal. The right ventricular  size is normal. There is normal pulmonary artery systolic pressure. The  estimated right ventricular systolic pressure is 97.9 mmHg.   3. Left atrial size was moderately dilated.   4. The mitral valve is grossly normal. Mild to  moderate mitral valve  regurgitation.   5. The aortic valve is tricuspid. Aortic valve regurgitation is not  visualized. No aortic stenosis is present.   6. Aortic dilatation noted. There is borderline dilatation of the aortic  root, measuring 38 mm.   7. The inferior vena cava is normal in size with greater than 50%  respiratory variability, suggesting right atrial pressure of 3 mmHg.   Comparison(s): No prior Echocardiogram.    Assessment and Plan:  1. Persistent afib Pending ablation 07/11/21 Pt's questions answered re recovery from  procedure He is not on rate control drugs with svr at at times   Cbc/bmet today   2. CHA2DS2VASc  score of 1 (age) Continue eliquis 5 mg bid States no missed doses for the last 3 weeks   Butch Penny C. Tyjanae Bartek, [facility] [address] [place],  [address] [phone]

## 2022-06-20 NOTE — Progress Notes (Signed)
Primary Care Physician: Kristen Loader, FNP Referring Physician: Dr. Wille Glaser III is a 66 y.o. male with a h/o afib with CVR that saw Dr. Curt Bears in September and was scheduled for an ablation pending 07/11/21. He reports he continues to be highly asymptomatic and well functioning with his afib. He usually will play pickle ball in the am and then go to the gym in the afternoons. He does notice a slow heart beat at times,when at rest,  non sustained in the 30's- 40's that will increase back to  normal range with activity.   His health is unchanged since he saw Dr. Curt Bears last and no recent illness. Weight is stable, he is not prone to retain fluid.   Today, he denies symptoms of palpitations, chest pain, shortness of breath, orthopnea, PND, lower extremity edema, dizziness, presyncope, syncope, or neurologic sequela. The patient is tolerating medications without difficulties and is otherwise without complaint today.   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   Left Chest tx cx3 26f   PONV (postoperative nausea and vomiting)    Past Surgical History:  Procedure Laterality Date   ACL implant     right knee    CARDIOVERSION N/A 08/21/2021   Procedure: CARDIOVERSION;  Surgeon: NThayer Headings MD;  [facility]  Service: Cardiovascular;  Laterality: N/A;   colonscopy      Excision of melanoma on chest wall  2013   TOTAL KNEE ARTHROPLASTY Right 05/29/2014   Procedure: RIGHT TOTAL KNEE ARTHROPLASTY WITH REMOVAL OF HARDWARE;  Surgeon: MMauri Pole MD;  [facility];  Service: Orthopedics;  Laterality: Right;     Current Outpatient Medications  Medication Sig Dispense Refill   apixaban (ELIQUIS) 5 MG TABS tablet Take 1 tablet (5 mg total) by mouth 2 (two) times daily. 180 tablet 3   diltiazem (CARDIZEM) 30 MG tablet Take 30 mg every 6 hours as needed for heart rate greater than 120 60 tablet 6   lisinopril-hydrochlorothiazide (ZESTORETIC) 20-12.5 MG tablet Take 1 tablet by mouth daily.     Omega 3 1000 MG CAPS Take 1,000 mg by mouth daily.     tamsulosin (FLOMAX) 0.4 MG CAPS capsule Take 1 capsule (0.4 mg total) by mouth daily. 30 capsule 0   No current facility-administered medications for this encounter.    No Known Allergies  Social History   Socioeconomic History   Marital status: Married    Spouse name: Not on file   Number of children: Not on file   Years of education: Not on file   Highest education level: Not on file  Occupational History   Not on file  Tobacco Use   Smoking status: Never   Smokeless tobacco: Never  Vaping Use   Vaping Use: Not on file  Substance and Sexual Activity   Alcohol use: Yes    Comment: occas    Drug use: No   Sexual activity: Not on file  Other Topics Concern   Not on file  Social History Narrative  Not on file   Social Determinants of Health   Financial Resource Strain: Not on file  Food Insecurity: Not on file  Transportation Needs: Not on file  Physical Activity: Not on file  Stress: Not on file  Social Connections: Not on file  Intimate Partner Violence: Not on file    Family History  Problem Relation Age of Onset   Hypertension Mother    Atrial fibrillation Mother    Hypertension Father     ROS- All systems are reviewed and negative except as per the HPI above  Physical Exam: Vitals:   06/20/22 1430  BP: 138/86  Pulse: 67  Weight: 94 kg  Height: '5\' 8"'$  (1.727 m)   Wt Readings from Last 3 Encounters:  06/20/22 94 kg  03/18/22 93.6 kg  02/06/22 93.4 kg    Labs: Lab Results  Component Value Date   NA 145 (H)  02/06/2022   K 4.8 02/06/2022   CL 103 02/06/2022   CO2 24 02/06/2022   GLUCOSE 78 02/06/2022   BUN 19 02/06/2022   CREATININE 1.22 02/06/2022   CALCIUM 10.2 02/06/2022   MG 2.3 02/06/2022   Lab Results  Component Value Date   INR 1.02 05/22/2014   Lab Results  Component Value Date   CHOL  03/09/2008    169        ATP III CLASSIFICATION:  <200     mg/dL   Desirable  200-239  mg/dL   Borderline High  >=240    mg/dL   High   HDL 31 (L) 03/09/2008   LDLCALC (H) 03/09/2008    106        Total Cholesterol/HDL:CHD Risk Coronary Heart Disease Risk Table                     Men   Women  1/2 Average Risk   3.4   3.3   TRIG 159 (H) 03/09/2008     GEN- The patient is well appearing, alert and oriented x 3 today.   Head- normocephalic, atraumatic Eyes-  Sclera clear, conjunctiva pink Ears- hearing intact Oropharynx- clear Neck- supple, no JVP Lymph- no cervical lymphadenopathy Lungs- Clear to ausculation bilaterally, normal work of breathing Heart-irregular rate and rhythm, no murmurs, rubs or gallops, PMI not laterally displaced GI- soft, NT, ND, + BS Extremities- no clubbing, cyanosis, or edema MS- no significant deformity or atrophy Skin- no rash or lesion Psych- euthymic mood, full affect Neuro- strength and sensation are intact  EKG-Vent. rate 67 BPM PR interval * ms QRS duration 70 ms QT/QTcB 386/407 ms P-R-T axes * 171 54  Suspect arm lead reversal, interpretation assumes no reversal Atrial fibrillation Septal infarct , age undetermined Lateral infarct , age undetermined Abnormal ECG When compared with ECG of 21-Aug-2021 12:02, PREVIOUS ECG IS PRESENT  Echo-1. Left ventricular ejection fraction, by estimation, is 60 to 65%. Left  ventricular ejection fraction by 3D volume is 65 %. The left ventricle has  normal function. The left ventricle has no regional wall motion  abnormalities. There is mild asymmetric  left ventricular hypertrophy of the basal-septal  segment. Left ventricular  diastolic function could not be evaluated.   2. Right ventricular systolic function is normal. The right ventricular  size is normal. There is normal pulmonary artery systolic pressure. The  estimated right ventricular systolic pressure is 44.0 mmHg.   3. Left atrial size was moderately dilated.   4. The mitral valve is grossly normal. Mild to  moderate mitral valve  regurgitation.   5. The aortic valve is tricuspid. Aortic valve regurgitation is not  visualized. No aortic stenosis is present.   6. Aortic dilatation noted. There is borderline dilatation of the aortic  root, measuring 38 mm.   7. The inferior vena cava is normal in size with greater than 50%  respiratory variability, suggesting right atrial pressure of 3 mmHg.   Comparison(s): No prior Echocardiogram.    Assessment and Plan:  1. Persistent afib Pending ablation 07/11/21 Pt's questions answered re recovery from  procedure He is not on rate control drugs with svr at at times   Cbc/bmet today   2. CHA2DS2VASc  score of 1 (age) Continue eliquis 5 mg bid States no missed doses for the last 3 weeks   Butch Penny C. Jaylise Peek, [facility] [address] [place], [place] [address] [phone]

## 2022-07-03 ENCOUNTER — Ambulatory Visit (HOSPITAL_BASED_OUTPATIENT_CLINIC_OR_DEPARTMENT_OTHER): Payer: Medicare Other

## 2022-07-03 ENCOUNTER — Telehealth (HOSPITAL_COMMUNITY): Payer: Self-pay | Admitting: Emergency Medicine

## 2022-07-03 NOTE — Telephone Encounter (Signed)
Reaching out to patient to offer assistance regarding upcoming cardiac imaging study; pt verbalizes understanding of appt date/time, parking situation and where to check in, pre-test NPO status and medications ordered, and verified current allergies; name and call back number provided for further questions should they arise Marchia Bond RN Navigator Cardiac Imaging [facility] [phone] office [phone] cell  Arrival 1130 [address] Denies iv issues Daily meds

## 2022-07-04 ENCOUNTER — Ambulatory Visit (HOSPITAL_BASED_OUTPATIENT_CLINIC_OR_DEPARTMENT_OTHER)
Admission: RE | Admit: 2022-07-04 | Discharge: 2022-07-04 | Disposition: A | Discharge status: Home / Self Care | Payer: Medicare Other | Source: Ambulatory Visit | Attending: Cardiology | Admitting: Cardiology

## 2022-07-04 DIAGNOSIS — I480 Paroxysmal atrial fibrillation: Secondary | ICD-10-CM | POA: Diagnosis not present

## 2022-07-04 MED ORDER — IOHEXOL 350 MG/ML SOLN
100.0000 mL | Freq: Once | INTRAVENOUS | Status: AC | PRN
  Administered 2022-07-04: 80 mL via INTRAVENOUS

## 2022-07-11 ENCOUNTER — Ambulatory Visit (HOSPITAL_BASED_OUTPATIENT_CLINIC_OR_DEPARTMENT_OTHER): Payer: Medicare Other | Admitting: Certified Registered Nurse Anesthetist

## 2022-07-11 ENCOUNTER — Ambulatory Visit (HOSPITAL_COMMUNITY)
Admission: RE | Admit: 2022-07-11 | Discharge: 2022-07-11 | Disposition: A | Discharge status: Home / Self Care | Payer: Medicare Other | Attending: Cardiology | Admitting: Cardiology

## 2022-07-11 ENCOUNTER — Ambulatory Visit (HOSPITAL_COMMUNITY): Payer: Medicare Other | Admitting: Certified Registered Nurse Anesthetist

## 2022-07-11 ENCOUNTER — Ambulatory Visit (HOSPITAL_COMMUNITY)
Admission: RE | Disposition: A | Discharge status: Home / Self Care | Payer: Self-pay | Source: Home / Self Care | Attending: Cardiology

## 2022-07-11 ENCOUNTER — Other Ambulatory Visit: Payer: Self-pay

## 2022-07-11 DIAGNOSIS — I4891 Unspecified atrial fibrillation: Secondary | ICD-10-CM | POA: Diagnosis not present

## 2022-07-11 DIAGNOSIS — I4819 Other persistent atrial fibrillation: Secondary | ICD-10-CM | POA: Insufficient documentation

## 2022-07-11 DIAGNOSIS — Z7901 Long term (current) use of anticoagulants: Secondary | ICD-10-CM | POA: Diagnosis not present

## 2022-07-11 DIAGNOSIS — I10 Essential (primary) hypertension: Secondary | ICD-10-CM | POA: Diagnosis not present

## 2022-07-11 HISTORY — PX: ATRIAL FIBRILLATION ABLATION: EP1191

## 2022-07-11 LAB — POCT ACTIVATED CLOTTING TIME
Activated Clotting Time: 304 seconds
Activated Clotting Time: 325 seconds

## 2022-07-11 SURGERY — ATRIAL FIBRILLATION ABLATION
Anesthesia: General

## 2022-07-11 MED ORDER — SODIUM CHLORIDE 0.9% FLUSH: 3.0000 mL | Freq: Two times a day (BID) | INTRAVENOUS | Status: DC

## 2022-07-11 MED ORDER — FENTANYL CITRATE (PF) 100 MCG/2ML IJ SOLN
25.0000 ug | INTRAMUSCULAR | Status: DC | PRN
  Administered 2022-07-11: 50 ug via INTRAVENOUS
  Administered 2022-07-11 (×2): 25 ug via INTRAVENOUS
  Filled 2022-07-11: qty 2

## 2022-07-11 MED ORDER — HEPARIN SODIUM (PORCINE) 1000 UNIT/ML IJ SOLN
INTRAMUSCULAR | Status: DC | PRN
  Administered 2022-07-11: 1000 [IU] via INTRAVENOUS

## 2022-07-11 MED ORDER — ONDANSETRON HCL 4 MG/2ML IJ SOLN
INTRAMUSCULAR | Status: DC | PRN
  Administered 2022-07-11: 4 mg via INTRAVENOUS

## 2022-07-11 MED ORDER — SODIUM CHLORIDE 0.9 % IV SOLN: INTRAVENOUS | Status: DC

## 2022-07-11 MED ORDER — FENTANYL CITRATE (PF) 100 MCG/2ML IJ SOLN
INTRAMUSCULAR | Status: AC
  Filled 2022-07-11: qty 2

## 2022-07-11 MED ORDER — DEXAMETHASONE SODIUM PHOSPHATE 10 MG/ML IJ SOLN
INTRAMUSCULAR | Status: DC | PRN
  Administered 2022-07-11: 5 mg via INTRAVENOUS

## 2022-07-11 MED ORDER — HEPARIN (PORCINE) IN NACL 1000-0.9 UT/500ML-% IV SOLN
INTRAVENOUS | Status: AC
  Filled 2022-07-11: qty 500

## 2022-07-11 MED ORDER — SODIUM CHLORIDE 0.9 % IV SOLN: 250.0000 mL | INTRAVENOUS | Status: DC | PRN

## 2022-07-11 MED ORDER — SODIUM CHLORIDE 0.9% FLUSH: 3.0000 mL | INTRAVENOUS | Status: DC | PRN

## 2022-07-11 MED ORDER — ONDANSETRON HCL 4 MG/2ML IJ SOLN: 4.0000 mg | Freq: Four times a day (QID) | INTRAMUSCULAR | Status: DC | PRN

## 2022-07-11 MED ORDER — HEPARIN (PORCINE) IN NACL 1000-0.9 UT/500ML-% IV SOLN
INTRAVENOUS | Status: DC | PRN
  Administered 2022-07-11 (×4): 500 mL

## 2022-07-11 MED ORDER — PROPOFOL 10 MG/ML IV BOLUS
INTRAVENOUS | Status: DC | PRN
  Administered 2022-07-11: 120 mg via INTRAVENOUS

## 2022-07-11 MED ORDER — FENTANYL CITRATE (PF) 250 MCG/5ML IJ SOLN
INTRAMUSCULAR | Status: DC | PRN
  Administered 2022-07-11: 50 ug via INTRAVENOUS

## 2022-07-11 MED ORDER — SUGAMMADEX SODIUM 200 MG/2ML IV SOLN
INTRAVENOUS | Status: DC | PRN
  Administered 2022-07-11: 200 mg via INTRAVENOUS

## 2022-07-11 MED ORDER — MIDAZOLAM HCL 5 MG/5ML IJ SOLN
INTRAMUSCULAR | Status: DC | PRN
  Administered 2022-07-11: 2 mg via INTRAVENOUS

## 2022-07-11 MED ORDER — ACETAMINOPHEN 325 MG PO TABS: 650.0000 mg | ORAL_TABLET | ORAL | Status: DC | PRN

## 2022-07-11 MED ORDER — LIDOCAINE 2% (20 MG/ML) 5 ML SYRINGE
INTRAMUSCULAR | Status: DC | PRN
  Administered 2022-07-11: 100 mg via INTRAVENOUS

## 2022-07-11 MED ORDER — PHENYLEPHRINE HCL-NACL 20-0.9 MG/250ML-% IV SOLN
INTRAVENOUS | Status: DC | PRN
  Administered 2022-07-11: 25 ug/min via INTRAVENOUS

## 2022-07-11 MED ORDER — ROCURONIUM BROMIDE 10 MG/ML (PF) SYRINGE
PREFILLED_SYRINGE | INTRAVENOUS | Status: DC | PRN
  Administered 2022-07-11: 30 mg via INTRAVENOUS
  Administered 2022-07-11: 70 mg via INTRAVENOUS

## 2022-07-11 MED ORDER — HEPARIN SODIUM (PORCINE) 1000 UNIT/ML IJ SOLN
INTRAMUSCULAR | Status: AC
  Filled 2022-07-11: qty 10

## 2022-07-11 MED ORDER — PROTAMINE SULFATE 10 MG/ML IV SOLN
INTRAVENOUS | Status: DC | PRN
  Administered 2022-07-11 (×4): 10 mg via INTRAVENOUS

## 2022-07-11 MED ORDER — HEPARIN SODIUM (PORCINE) 1000 UNIT/ML IJ SOLN
INTRAMUSCULAR | Status: DC | PRN
  Administered 2022-07-11: 1000 [IU] via INTRAVENOUS
  Administered 2022-07-11: 15000 [IU] via INTRAVENOUS
  Administered 2022-07-11: 3000 [IU] via INTRAVENOUS

## 2022-07-11 MED ORDER — PHENYLEPHRINE HCL (PRESSORS) 10 MG/ML IV SOLN
INTRAVENOUS | Status: DC | PRN
  Administered 2022-07-11: 80 ug via INTRAVENOUS

## 2022-07-11 SURGICAL SUPPLY — 18 items
BAG SNAP BAND KOVER 36X36 (MISCELLANEOUS) IMPLANT
CATH ABLAT QDOT MICRO BI TC FJ (CATHETERS) IMPLANT
CATH OCTARAY 2.0 F 3-3-3-3-3 (CATHETERS) IMPLANT
CATH PIGTAIL STEERABLE D1 8.7 (WIRE) IMPLANT
CATH S-M CIRCA TEMP PROBE (CATHETERS) IMPLANT
CATH SOUNDSTAR ECO 8FR (CATHETERS) IMPLANT
CATH WEB BI DIR CSDF CRV REPRO (CATHETERS) IMPLANT
CLOSURE PERCLOSE PROSTYLE (VASCULAR PRODUCTS) IMPLANT
COVER SWIFTLINK CONNECTOR (BAG) ×2 IMPLANT
PACK EP LATEX FREE (CUSTOM PROCEDURE TRAY) ×1
PACK EP LF (CUSTOM PROCEDURE TRAY) ×2 IMPLANT
PAD DEFIB RADIO PHYSIO CONN (PAD) ×2 IMPLANT
PATCH CARTO3 (PAD) IMPLANT
SHEATH CARTO VIZIGO SM CVD (SHEATH) IMPLANT
SHEATH PINNACLE 7F 10CM (SHEATH) IMPLANT
SHEATH PINNACLE 8F 10CM (SHEATH) IMPLANT
SHEATH PINNACLE 9F 10CM (SHEATH) IMPLANT
TUBING SMART ABLATE COOLFLOW (TUBING) IMPLANT

## 2022-07-11 NOTE — Anesthesia Procedure Notes (Signed)
Procedure Name: Intubation Date/Time: 07/11/2022 1:21 PM  Performed by: Glynda Jaeger, CRNAPre-anesthesia Checklist: Patient identified, Patient being monitored, Timeout performed, Emergency Drugs available and Suction available Patient Re-evaluated:Patient Re-evaluated prior to induction Oxygen Delivery Method: Circle System Utilized Preoxygenation: Pre-oxygenation with 100% oxygen Induction Type: IV induction Ventilation: Mask ventilation without difficulty Laryngoscope Size: Mac and 4 Grade View: Grade I Tube type: Oral Tube size: 7.5 mm Number of attempts: 1 Airway Equipment and Method: Stylet Placement Confirmation: ETT inserted through vocal cords under direct vision, positive ETCO2 and breath sounds checked- equal and bilateral Secured at: 23 cm Tube secured with: Tape Dental Injury: Teeth and Oropharynx as per pre-operative assessment

## 2022-07-11 NOTE — Anesthesia Postprocedure Evaluation (Signed)
Anesthesia Post Note  Patient: Lawrence Compton  Procedure(s) Performed: ATRIAL FIBRILLATION ABLATION     Patient location during evaluation: Cath Lab Anesthesia Type: General Level of consciousness: sedated Pain management: pain level controlled Vital Signs Assessment: post-procedure vital signs reviewed and stable Respiratory status: spontaneous breathing and respiratory function stable Cardiovascular status: stable Postop Assessment: no apparent nausea or vomiting Anesthetic complications: no   There were no known notable events for this encounter.  Last Vitals:  Vitals:   07/11/22 1700 07/11/22 1715  BP: 119/81 119/87  Pulse: (!) 53 (!) 58  Resp: 13 14  Temp:    SpO2: 90% 92%    Last Pain:  Vitals:   07/11/22 1717  TempSrc:   PainSc: 7                  [name]

## 2022-07-11 NOTE — Discharge Instructions (Signed)
Post procedure care instructions No driving for 4 days. No lifting over 5 lbs for 1 week. No vigorous or sexual activity for 1 week. You may return to work/your usual activities on 07/19/22. Keep procedure site clean & dry. If you notice increased pain, swelling, bleeding or pus, call/return!  You may shower after 24 hours, but no soaking in baths/hot tubs/pools for 1 week.    You have an appointment set up with the [facility].  Multiple studies have shown that being followed by a dedicated atrial fibrillation clinic in addition to the standard care you receive from your other physicians improves health. We believe that enrollment in the atrial fibrillation clinic will allow [place] to better care for you.   The phone number to the [facility] is [phone]. The clinic is staffed Monday through Friday from 8:30am to 5pm.  Directions: The clinic is located in the [facility], [place] the hospital at the MAIN ENTRANCE "A", use [address] to the 6th floor.  Registration desk to the right of elevators on 6th floor  If you have any trouble locating the clinic, please don't hesitate to call [phone].

## 2022-07-11 NOTE — Anesthesia Preprocedure Evaluation (Signed)
Anesthesia Evaluation  Patient identified by MRN, date of birth, ID band Patient awake    Reviewed: Allergy & Precautions, H&P , NPO status , Patient's Chart, lab work & pertinent test results  Airway Mallampati: II  TM Distance: >3 FB Neck ROM: Full    Dental no notable dental hx.    Pulmonary neg pulmonary ROS   Pulmonary exam normal breath sounds clear to auscultation       Cardiovascular hypertension, Normal cardiovascular exam+ dysrhythmias Atrial Fibrillation  Rhythm:Irregular Rate:Normal     Neuro/Psych negative neurological ROS  negative psych ROS   GI/Hepatic negative GI ROS, Neg liver ROS,,,  Endo/Other  negative endocrine ROS    Renal/GU negative Renal ROS  negative genitourinary   Musculoskeletal negative musculoskeletal ROS (+)    Abdominal   Peds negative pediatric ROS (+)  Hematology negative hematology ROS (+)   Anesthesia Other Findings   Reproductive/Obstetrics negative OB ROS                             Anesthesia Physical Anesthesia Plan  ASA: 3  Anesthesia Plan: General   Post-op Pain Management: Minimal or no pain anticipated   Induction: Intravenous  PONV Risk Score and Plan: 2 and Ondansetron, Dexamethasone and Treatment may vary due to age or medical condition  Airway Management Planned: Oral ETT  Additional Equipment:   Intra-op Plan:   Post-operative Plan: Extubation in OR  Informed Consent: I have reviewed the patients History and Physical, chart, labs and discussed the procedure including the risks, benefits and alternatives for the proposed anesthesia with the patient or authorized representative who has indicated his/her understanding and acceptance.     Dental advisory given  Plan Discussed with: CRNA and Surgeon  Anesthesia Plan Comments:        Anesthesia Quick Evaluation

## 2022-07-11 NOTE — Transfer of Care (Signed)
Immediate Anesthesia Transfer of Care Note  Patient: Lawrence Compton  Procedure(s) Performed: ATRIAL FIBRILLATION ABLATION  Patient Location: Cath Lab  Anesthesia Type:General  Level of Consciousness: awake, alert , and oriented  Airway & Oxygen Therapy: room air  Post-op Assessment: Report given to RN and Post -op Vital signs reviewed and stable  Post vital signs: Reviewed and stable  Last Vitals:  Vitals Value Taken Time  BP 118/83 07/11/22 1524  Temp 36.7 C 07/11/22 1518  Pulse 54 07/11/22 1527  Resp 12 07/11/22 1527  SpO2 100 % 07/11/22 1527  Vitals shown include unvalidated device data.  Last Pain:  Vitals:   07/11/22 1518  TempSrc: Oral  PainSc: 0-No pain         Complications: There were no known notable events for this encounter.

## 2022-07-11 NOTE — Interval H&P Note (Signed)
History and Physical Interval Note:  07/11/2022 12:03 PM  Lawrence Compton  has presented today for surgery, with the diagnosis of afib.  The various methods of treatment have been discussed with the patient and family. After consideration of risks, benefits and other options for treatment, the patient has consented to  Procedure(s): ATRIAL FIBRILLATION ABLATION (N/A) as a surgical intervention.  The patient's history has been reviewed, patient examined, no change in status, stable for surgery.  I have reviewed the patient's chart and labs.  Questions were answered to the patient's satisfaction.     Yarrow Linhart [employer]

## 2022-07-14 ENCOUNTER — Encounter (HOSPITAL_COMMUNITY): Payer: Self-pay | Admitting: Cardiology

## 2022-07-14 MED FILL — Heparin Sod (Porcine)-NaCl IV Soln 1000 Unit/500ML-0.9%: INTRAVENOUS | Qty: 500 | Status: AC

## 2022-07-18 ENCOUNTER — Encounter (HOSPITAL_COMMUNITY): Payer: Self-pay

## 2022-07-18 ENCOUNTER — Ambulatory Visit (HOSPITAL_COMMUNITY): Admit: 2022-07-18 | Payer: Medicare Other | Admitting: Cardiology

## 2022-07-18 SURGERY — ATRIAL FIBRILLATION ABLATION
Anesthesia: General

## 2022-07-22 DIAGNOSIS — Z0000 Encounter for general adult medical examination without abnormal findings: Secondary | ICD-10-CM | POA: Diagnosis not present

## 2022-07-25 DIAGNOSIS — I10 Essential (primary) hypertension: Secondary | ICD-10-CM | POA: Diagnosis not present

## 2022-07-25 DIAGNOSIS — D6869 Other thrombophilia: Secondary | ICD-10-CM | POA: Diagnosis not present

## 2022-07-25 DIAGNOSIS — E785 Hyperlipidemia, unspecified: Secondary | ICD-10-CM | POA: Diagnosis not present

## 2022-07-29 DIAGNOSIS — K080 Exfoliation of teeth due to systemic causes: Secondary | ICD-10-CM | POA: Diagnosis not present

## 2022-08-07 ENCOUNTER — Ambulatory Visit: Payer: Medicare Other | Attending: Internal Medicine | Admitting: Internal Medicine

## 2022-08-07 ENCOUNTER — Encounter (HOSPITAL_COMMUNITY): Payer: Self-pay | Admitting: *Deleted

## 2022-08-07 ENCOUNTER — Encounter: Payer: Self-pay | Admitting: Internal Medicine

## 2022-08-07 VITALS — BP 122/84 | HR 68 | Ht 68.0 in | Wt 208.0 lb

## 2022-08-07 DIAGNOSIS — I480 Paroxysmal atrial fibrillation: Secondary | ICD-10-CM

## 2022-08-07 NOTE — Progress Notes (Signed)
Cardiology Office Note:    Date:  08/07/2022   ID:  Lawrence Compton, DOB 08-Jan-1956, MRN [id number]  PCP:  Kristen Loader, FNP   [facility] Providers Cardiologist:  Janina Mayo, MD Electrophysiologist:  Constance Haw, MD     Referring MD: Kristen Loader, FNP   No chief complaint on file. New Onset Atrial Fibrillation  History of Present Illness:   Initial HPI Lawrence Compton is a 67 y.o. male with a hx of htn, hld, melanoma on the chest wall s/p removal referral from [place] for new onset atrial fibrillation  He presents with atrial fibrillation. He was given eliquis samples. No palpitations. He denies syncope. He notes that his heart rates can get to the 40s.  No hx of lung disease. No signs of hx of sleep apnea. He has not had a sleep study.  He denies smoking history. His mother has atrial fibrillation and has challenges with RVR.  In terms of his past cardiac hx. He has no other hx of cardiac dx. He had chest pain  in 2009 and underwent stress test. He had ACS r/o prior in the ED.   He's on lisinopril- HCTz for HTN. Good control.  He is very active and works out consistently.    He does no have chest pain and sob on exertion. No LE swelling. No PND/orthopnea  07/18/2021 TSH- 1.75 LDL 113, TC 173 , HDL 42  Interim Hx: Underwent DDCV. Post procedure ECG shows sinus bradycardia with 1st degree AV block and septal Q waves (not new). He says he is doing well today. He denies any lightheadedness or syncope. He was asymptomatic prior. He did the kardiamobile in the office today which noted 'atrial fibrillation' Ekg shows sinus brady, p wave amplitude a bit low, has irregular narrow complex rhythm with PACs. Discussed this may not be a good monitoring tool for him.   Interim Hx 2/8/20204 Did well until 1/15. He has [employer]. He had intermittent periods of normal rhythm. 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   Left Chest tx cx3 40f   PONV (postoperative nausea and vomiting)     Past Surgical History:  Procedure Laterality Date   ACL implant     right knee    ATRIAL FIBRILLATION ABLATION N/A 07/11/2022   Procedure: ATRIAL FIBRILLATION ABLATION;  Surgeon: CConstance Haw MD;  [facility];  Service: Cardiovascular;  Laterality: N/A;   CARDIOVERSION N/A 08/21/2021   Procedure: CARDIOVERSION;  Surgeon: NThayer Headings MD;  [facility];  Service: Cardiovascular;  Laterality: N/A;   colonscopy      Excision of melanoma on chest wall  2013   TOTAL KNEE ARTHROPLASTY Right 05/29/2014   Procedure: RIGHT TOTAL KNEE ARTHROPLASTY WITH REMOVAL OF HARDWARE;  Surgeon: MMauri Pole MD;  Location: [facility];  Service: Orthopedics;  Laterality: Right;    Current Medications: No outpatient medications have been marked as taking for the 08/07/22 encounter (Appointment) with Janina Mayo, MD.     Allergies:   Patient has no known allergies.   Social History   Socioeconomic History   Marital status: Married    Spouse name: Not on file   Number of children: Not on file   Years of education: Not on file   Highest education level: Not on file  Occupational History   Not on file  Tobacco Use   Smoking status: Never   Smokeless tobacco: Never  Vaping Use   Vaping Use: Not on file  Substance and Sexual Activity   Alcohol use: Yes    Comment: occas    Drug use: No    Sexual activity: Not on file  Other Topics Concern   Not on file  Social History Narrative   Not on file   Social Determinants of Health   Financial Resource Strain: Not on file  Food Insecurity: Not on file  Transportation Needs: Not on file  Physical Activity: Not on file  Stress: Not on file  Social Connections: Not on file     Family History: Mother- atrial fibrillation. Father- passed, died of cancer. No cardiac disease in siblings. HTN.  ROS:   Please see the history of present illness.    All other systems reviewed and are negative.  EKGs/Labs/Other Studies Reviewed:    The following studies were reviewed today:   EKG:  EKG is  ordered today.  The ekg ordered today demonstrates   08/01/2021-Afib rates 60s  HR 59 sinus brady, 1st degree AV block , PACs  08/07/2022- afib   Recent Labs: 02/06/2022: ALT 22; Magnesium 2.3; TSH 2.370 06/20/2022: BUN 25; Creatinine, Ser 1.33; Hemoglobin 16.3; Platelets 202; Potassium 3.9; Sodium 141   Recent Lipid Panel    Component Value Date/Time   CHOL  03/09/2008 0710    169        ATP Compton CLASSIFICATION:  <200     mg/dL   Desirable  200-239  mg/dL   Borderline High  >=240    mg/dL   High   TRIG 159 (H) 03/09/2008 0710   HDL 31 (L) 03/09/2008 0710   CHOLHDL 5.5 03/09/2008 0710   VLDL 32 03/09/2008 0710   LDLCALC (H) 03/09/2008 0710    106        Total Cholesterol/HDL:CHD Risk Coronary Heart Disease Risk Table                     Men   Women  1/2 Average Risk   3.4   3.3     Risk Assessment/Calculations:    CHA2DS2-VASc Score =     This indicates a  % annual risk of stroke. The patient's score is based upon:              Physical Exam:    VS:   There were no vitals filed for this visit.       There were no vitals taken for this visit.    Wt Readings from Last 3 Encounters:  07/11/22 201 lb (91.2 kg)  06/20/22 207 lb 3.2 oz (94 kg)  03/18/22 206 lb 6.4 oz (93.6 kg)     GEN:  Well nourished,  well developed in no acute distress HEENT: Normal NECK: No JVD; No carotid bruits LYMPHATICS: No lymphadenopathy CARDIAC: RRR, no murmurs, rubs, gallops RESPIRATORY:  Clear to auscultation without rales, wheezing or rhonchi  ABDOMEN: Soft, non-tender, non-distended MUSCULOSKELETAL:  No edema; No deformity  SKIN: Warm and dry NEUROLOGIC:  Alert and oriented x 3 PSYCHIATRIC:  Normal affect   ASSESSMENT:    #Paroxysmal Atrial Fibrillation: Chads2vasc =2. He is in atrial fibrillation today, rate controlled. Noted HR down to 40s on the initial visit hesitant to do continue BB/Ca channel blocker. Will continue dilt as needed.  He has no signs of CHF or CAD.  He is s/p DCCV 08/21/2021.  Referred to Dr. Curt Bears, s/p PVI.  We discussed if he notes persistent high heart rates to let us know and we can send a monitor. He is a good candidate for sotalol or dofetolide (Qtc 399 ms) - s/p PVI 07/11/2022; back in afib - continue eliquis 5 mg BID - cont diltiazem PRN for HR > 120 bpm - will see Roderic Palau soon, can consider AAD vs repeat PVI  HLD: start atorvastatin 10 mg three times a week. Hesitant to do more at this time PLAN:    In order of problems listed above:  FU with EP Start atorvastatin 10 mg three times a week Follow up 12 months      Shared Decision Making/Informed Consent The risks (stroke, cardiac arrhythmias rarely resulting in the need for a temporary or permanent pacemaker, skin irritation or burns and complications associated with conscious sedation including aspiration, arrhythmia, respiratory failure and death), benefits (restoration of normal sinus rhythm) and alternatives of a direct current cardioversion were explained in detail to Mr. Grau and he agrees to proceed.      Medication Adjustments/Labs and Tests Ordered: Current medicines are reviewed at length with the patient today.  Concerns regarding medicines are outlined above.  No orders of the defined types were  placed in this encounter.  No orders of the defined types were placed in this encounter.   There are no Patient Instructions on file for this visit.   Signed, Janina Mayo, MD  08/07/2022 10:50 AM    [place]

## 2022-08-07 NOTE — Patient Instructions (Signed)
Medication Instructions:  No Changes In Medications at this time.  *If you need a refill on your cardiac medications before your next appointment, please call your pharmacy*  Lab Work: None Ordered At This Time.  If you have labs (blood work) drawn today and your tests are completely normal, you will receive your results only by: [place] (if you have MyChart) OR A paper copy in the mail If you have any lab test that is abnormal or we need to change your treatment, we will call you to review the results.  Testing/Procedures: None Ordered At This Time.   Follow-Up: At [facility], you and your health needs are our priority.  As part of our continuing mission to provide you with exceptional heart care, we have created designated Provider Care Teams.  These Care Teams include your primary Cardiologist (physician) and Advanced Practice Providers (APPs -  Physician Assistants and Nurse Practitioners) who all work together to provide you with the care you need, when you need it.  Your next appointment:   1 year(s)  Provider:   Janina Mayo, MD

## 2022-08-12 ENCOUNTER — Ambulatory Visit (HOSPITAL_COMMUNITY)
Admission: RE | Admit: 2022-08-12 | Discharge: 2022-08-12 | Disposition: A | Discharge status: Home / Self Care | Payer: Medicare Other | Source: Ambulatory Visit | Attending: Nurse Practitioner | Admitting: Nurse Practitioner

## 2022-08-12 ENCOUNTER — Encounter (HOSPITAL_COMMUNITY): Payer: Self-pay | Admitting: Nurse Practitioner

## 2022-08-12 VITALS — BP 146/80 | HR 60 | Ht 68.0 in | Wt 209.0 lb

## 2022-08-12 DIAGNOSIS — I4819 Other persistent atrial fibrillation: Secondary | ICD-10-CM | POA: Diagnosis not present

## 2022-08-12 DIAGNOSIS — I480 Paroxysmal atrial fibrillation: Secondary | ICD-10-CM

## 2022-08-12 DIAGNOSIS — D6869 Other thrombophilia: Secondary | ICD-10-CM | POA: Diagnosis not present

## 2022-08-12 DIAGNOSIS — Z8249 Family history of ischemic heart disease and other diseases of the circulatory system: Secondary | ICD-10-CM | POA: Diagnosis not present

## 2022-08-12 DIAGNOSIS — Z006 Encounter for examination for normal comparison and control in clinical research program: Secondary | ICD-10-CM

## 2022-08-12 DIAGNOSIS — Z7901 Long term (current) use of anticoagulants: Secondary | ICD-10-CM | POA: Diagnosis not present

## 2022-08-12 LAB — BASIC METABOLIC PANEL
Anion gap: 9 (ref 5–15)
BUN: 15 mg/dL (ref 8–23)
CO2: 22 mmol/L (ref 22–32)
Calcium: 9.2 mg/dL (ref 8.9–10.3)
Chloride: 104 mmol/L (ref 98–111)
Creatinine, Ser: 1.06 mg/dL (ref 0.61–1.24)
GFR, Estimated: >60 mL/min (ref >60)
Glucose, Bld: 92 mg/dL (ref 70–99)
Potassium: 4 mmol/L (ref 3.5–5.1)
Sodium: 135 mmol/L (ref 135–145)

## 2022-08-12 LAB — CBC
HCT: 46.6 % (ref 39.0–52.0)
Hemoglobin: 16.5 g/dL (ref 13.0–17.0)
MCH: 30.4 pg (ref 26.0–34.0)
MCHC: 35.4 g/dL (ref 30.0–36.0)
MCV: 85.8 fL (ref 80.0–100.0)
Platelets: 181 10*3/uL (ref 150–400)
RBC: 5.43 MIL/uL (ref 4.22–5.81)
RDW: 12.3 % (ref 11.5–15.5)
WBC: 6.3 10*3/uL (ref 4.0–10.5)
nRBC: 0 % (ref 0.0–0.2)

## 2022-08-12 NOTE — Research (Signed)
MASIMO Informed Consent   Subject Name: Lawrence Compton  Subject met inclusion and exclusion criteria.  The informed consent form, study requirements and expectations were reviewed with the subject and questions and concerns were addressed prior to the signing of the consent form.  The subject verbalized understanding of the trial requirements.  The subject agreed to participate in the Masimo trial and signed the informed consent on 13/Feb/2024.  The informed consent was obtained prior to performance of any protocol-specific procedures for the subject.  A copy of the signed informed consent was given to the subject and a copy was placed in the subject's medical record.   Tori Milks Kaydan Wilhoite

## 2022-08-12 NOTE — Patient Instructions (Addendum)
Cardioversion scheduled for: Thursday, February 15th   - Arrive at the [facility] and go to admitting at 930am   - Do not eat or drink anything after midnight the night prior to your procedure.   - Take all your morning medication (except diabetic medications) with a sip of water prior to arrival.  - You will not be able to drive home after your procedure.    - Do NOT miss any doses of your blood thinner - if you should miss a dose please notify our office immediately.   - If you feel as if you go back into normal rhythm prior to scheduled cardioversion, please notify our office immediately.  If your procedure is canceled in the cardioversion suite you will be charged a cancellation fee.

## 2022-08-12 NOTE — H&P (View-Only) (Signed)
Primary Care Physician: Kristen Loader, FNP Referring Physician: Dr. Wille Glaser III is a 67 y.o. male with a h/o afib with CVR that saw Dr. Curt Bears in September and was scheduled for an ablation pending 07/11/21. He reports he continues to be highly asymptomatic and well functioning with his afib. He usually will play pickle ball in the am and then go to the gym in the afternoons. He does notice a slow heart beat at times,when at rest, non sustained in the 30's- 40's that will increase back to  normal range with activity.   His health is unchanged since he saw Dr. Curt Bears last and no recent illness. Weight is stable, he is not prone to retain fluid.   F/u in afib clinic, 08/12/22. He is s/p ablation 07/11/22. He feels he went back into afib on 1/25. He has not felt bad with afib and it is rate controlled. He had been going to the gym and carrying out usual activities.  No missed anticoagulation and I discussed pursing a cardioversion which he is in agreement.   Today, he denies symptoms of palpitations, chest pain, shortness of breath, orthopnea, PND, lower extremity edema, dizziness, presyncope, syncope, or neurologic sequela. The patient is tolerating medications without difficulties and is otherwise without complaint today.   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   Left Chest tx cx3 67f   PONV (postoperative nausea and vomiting)    Past Surgical History:  Procedure Laterality Date   ACL implant     right knee    ATRIAL FIBRILLATION ABLATION N/A 07/11/2022   Procedure: ATRIAL FIBRILLATION ABLATION;  Surgeon: CConstance Haw  MD;  [facility];  Service: Cardiovascular;  Laterality: N/A;   CARDIOVERSION N/A 08/21/2021   Procedure: CARDIOVERSION;  Surgeon: NThayer Headings MD;  [facility];  Service: Cardiovascular;  Laterality: N/A;   colonscopy      Excision of melanoma on chest wall  2013   TOTAL KNEE ARTHROPLASTY Right 05/29/2014   Procedure: RIGHT TOTAL KNEE ARTHROPLASTY WITH REMOVAL OF HARDWARE;  Surgeon: MMauri Pole MD;  [facility];  Service: Orthopedics;  Laterality: Right;    Current Outpatient Medications  Medication Sig Dispense Refill   apixaban (ELIQUIS) 5 MG TABS tablet Take 1 tablet (5 mg total) by mouth 2 (two) times daily. 180 tablet 3   atorvastatin (LIPITOR) 10 MG tablet Take 10 mg by mouth daily.     diclofenac Sodium (VOLTAREN) 1 % GEL Apply 1 Application topically 2 (two) times daily as needed (knee pain).     diltiazem (CARDIZEM) 30 MG tablet Take 30 mg every 6 hours as needed for heart rate greater than 120 60 tablet 6   lisinopril-hydrochlorothiazide (ZESTORETIC) 20-12.5 MG tablet Take 1 tablet by mouth daily.     tamsulosin (FLOMAX) 0.4 MG CAPS capsule Take 1 capsule (0.4 mg total) by mouth daily. 30 capsule 0   No current facility-administered medications for this encounter.    No Known Allergies  Social History   Socioeconomic  History   Marital status: Married    Spouse name: Not on file   Number of children: Not on file   Years of education: Not on file   Highest education level: Not on file  Occupational History   Not on file  Tobacco Use   Smoking status: Never   Smokeless tobacco: Never  Vaping Use   Vaping Use: Not on file  Substance and Sexual Activity   Alcohol use: Yes    Comment: occas    Drug use: No   Sexual activity: Not on file  Other Topics Concern   Not on file  Social History Narrative   Not on file   Social Determinants of Health   Financial Resource Strain: Not on file  Food Insecurity: Not on file   Transportation Needs: Not on file  Physical Activity: Not on file  Stress: Not on file  Social Connections: Not on file  Intimate Partner Violence: Not on file    Family History  Problem Relation Age of Onset   Hypertension Mother    Atrial fibrillation Mother    Hypertension Father     ROS- All systems are reviewed and negative except as per the HPI above  Physical Exam: Vitals:   08/12/22 1017  BP: (!) 146/80  Pulse: 60  Weight: 94.8 kg  Height: 5' 8"$  (1.727 m)   Wt Readings from Last 3 Encounters:  08/12/22 94.8 kg  08/07/22 94.3 kg  07/11/22 91.2 kg    Labs: Lab Results  Component Value Date   NA 141 06/20/2022   K 3.9 06/20/2022   CL 109 06/20/2022   CO2 24 06/20/2022   GLUCOSE 104 (H) 06/20/2022   BUN 25 (H) 06/20/2022   CREATININE 1.33 (H) 06/20/2022   CALCIUM 9.3 06/20/2022   MG 2.3 02/06/2022   Lab Results  Component Value Date   INR 1.02 05/22/2014   Lab Results  Component Value Date   CHOL  03/09/2008    169        ATP III CLASSIFICATION:  <200     mg/dL   Desirable  200-239  mg/dL   Borderline High  >=240    mg/dL   High   HDL 31 (L) 03/09/2008   LDLCALC (H) 03/09/2008    106        Total Cholesterol/HDL:CHD Risk Coronary Heart Disease Risk Table                     Men   Women  1/2 Average Risk   3.4   3.3   TRIG 159 (H) 03/09/2008     GEN- The patient is well appearing, alert and oriented x 3 today.   Head- normocephalic, atraumatic Eyes-  Sclera clear, conjunctiva pink Ears- hearing intact Oropharynx- clear Neck- supple, no JVP Lymph- no cervical lymphadenopathy Lungs- Clear to ausculation bilaterally, normal work of breathing Heart-irregular rate and rhythm, no murmurs, rubs or gallops, PMI not laterally displaced GI- soft, NT, ND, + BS Extremities- no clubbing, cyanosis, or edema MS- no significant deformity or atrophy Skin- no rash or lesion Psych- euthymic mood, full affect Neuro- strength and sensation are  intact  EKG- Vent. rate 60 BPM PR interval * ms QRS duration 74 ms QT/QTcB 372/372 ms P-R-T axes * 180 41  Suspect arm lead reversal, interpretation assumes no reversal Atrial fibrillation Septal infarct , age undetermined Lateral infarct , age undetermined Abnormal ECG When compared with ECG of 11-Jul-2022 15:25, PREVIOUS ECG  IS PRESENT  Echo-1. Left ventricular ejection fraction, by estimation, is 60 to 65%. Left  ventricular ejection fraction by 3D volume is 65 %. The left ventricle has  normal function. The left ventricle has no regional wall motion  abnormalities. There is mild asymmetric  left ventricular hypertrophy of the basal-septal segment. Left ventricular  diastolic function could not be evaluated.   2. Right ventricular systolic function is normal. The right ventricular  size is normal. There is normal pulmonary artery systolic pressure. The  estimated right ventricular systolic pressure is [id number] mmHg.   3. Left atrial size was moderately dilated.   4. The mitral valve is grossly normal. Mild to moderate mitral valve  regurgitation.   5. The aortic valve is tricuspid. Aortic valve regurgitation is not  visualized. No aortic stenosis is present.   6. Aortic dilatation noted. There is borderline dilatation of the aortic  root, measuring 38 mm.   7. The inferior vena cava is normal in size with greater than 50%  respiratory variability, suggesting right atrial pressure of 3 mmHg.   Comparison(s): No prior Echocardiogram.    Assessment and Plan:  1. Persistent afib S/p ablation 07/11/21 and returned to afib 07/24/22 We discussed that return of afib in the first 3 months is not uncommon and does not mean the procedure was not  successful. We would like for him to heal from the procedure in SR , so will schedule for cardioversion and he is in agreement, risk vrs benefit discussed  If afib persists outside the 3 month afib ablation window, then  repeat ablation  or  antiarrythmic's can be discussed at that time on f/u with Dr. Curt Bears 4/16  2. CHA2DS2VASc  score of 1 (age) Continue eliquis 5 mg bid States no missed doses for the last 3 weeks   F/u one week after the cardioversion   Butch Penny C. Lawrence Mitch, [facility] [address] [place], [place] [address] [phone]

## 2022-08-12 NOTE — Progress Notes (Signed)
Primary Care Physician: Kristen Loader, FNP Referring Physician: Dr. Wille Glaser III is a 67 y.o. male with a h/o afib with CVR that saw Dr. Curt Bears in September and was scheduled for an ablation pending 07/11/21. He reports he continues to be highly asymptomatic and well functioning with his afib. He usually will play pickle ball in the am and then go to the gym in the afternoons. He does notice a slow heart beat at times,when at rest, non sustained in the 30's- 40's that will increase back to  normal range with activity.   His health is unchanged since he saw Dr. Curt Bears last and no recent illness. Weight is stable, he is not prone to retain fluid.   F/u in afib clinic, 08/12/22. He is s/p ablation 07/11/22. He feels he went back into afib on 1/25. He has not felt bad with afib and it is rate controlled. He had been going to the gym and carrying out usual activities.  No missed anticoagulation and I discussed pursing a cardioversion which he is in agreement.   Today, he denies symptoms of palpitations, chest pain, shortness of breath, orthopnea, PND, lower extremity edema, dizziness, presyncope, syncope, or neurologic sequela. The patient is tolerating medications without difficulties and is otherwise without complaint today.   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   Left Chest tx cx3 7f   PONV (postoperative nausea and vomiting)    Past Surgical History:  Procedure Laterality Date   ACL implant     right knee    ATRIAL FIBRILLATION ABLATION N/A 07/11/2022   Procedure: ATRIAL FIBRILLATION ABLATION;  Surgeon: CConstance Haw  MD;  [facility];  Service: Cardiovascular;  Laterality: N/A;   CARDIOVERSION N/A 08/21/2021   Procedure: CARDIOVERSION;  Surgeon: NThayer Headings MD;  [facility];  Service: Cardiovascular;  Laterality: N/A;   colonscopy      Excision of melanoma on chest wall  2013   TOTAL KNEE ARTHROPLASTY Right 05/29/2014   Procedure: RIGHT TOTAL KNEE ARTHROPLASTY WITH REMOVAL OF HARDWARE;  Surgeon: MMauri Pole MD;  Location: [facility];  Service: Orthopedics;  Laterality: Right;    Current Outpatient Medications  Medication Sig Dispense Refill   apixaban (ELIQUIS) 5 MG TABS tablet Take 1 tablet (5 mg total) by mouth 2 (two) times daily. 180 tablet 3   atorvastatin (LIPITOR) 10 MG tablet Take 10 mg by mouth daily.     diclofenac Sodium (VOLTAREN) 1 % GEL Apply 1 Application topically 2 (two) times daily as needed (knee pain).     diltiazem (CARDIZEM) 30 MG tablet Take 30 mg every 6 hours as needed for heart rate greater than 120 60 tablet 6   lisinopril-hydrochlorothiazide (ZESTORETIC) 20-12.5 MG tablet Take 1 tablet by mouth daily.     tamsulosin (FLOMAX) 0.4 MG CAPS capsule Take 1 capsule (0.4 mg total) by mouth daily. 30 capsule 0   No current facility-administered medications for this encounter.    No Known Allergies  Social History   Socioeconomic  History   Marital status: Married    Spouse name: Not on file   Number of children: Not on file   Years of education: Not on file   Highest education level: Not on file  Occupational History   Not on file  Tobacco Use   Smoking status: Never   Smokeless tobacco: Never  Vaping Use   Vaping Use: Not on file  Substance and Sexual Activity   Alcohol use: Yes    Comment: occas    Drug use: No   Sexual activity: Not on file  Other Topics Concern   Not on file  Social History Narrative   Not on file   Social Determinants of Health   Financial Resource Strain: Not on file  Food Insecurity: Not on file   Transportation Needs: Not on file  Physical Activity: Not on file  Stress: Not on file  Social Connections: Not on file  Intimate Partner Violence: Not on file    Family History  Problem Relation Age of Onset   Hypertension Mother    Atrial fibrillation Mother    Hypertension Father     ROS- All systems are reviewed and negative except as per the HPI above  Physical Exam: Vitals:   08/12/22 1017  BP: (!) 146/80  Pulse: 60  Weight: 94.8 kg  Height: 5' 8"$  (1.727 m)   Wt Readings from Last 3 Encounters:  08/12/22 94.8 kg  08/07/22 94.3 kg  07/11/22 91.2 kg    Labs: Lab Results  Component Value Date   NA 141 06/20/2022   K 3.9 06/20/2022   CL 109 06/20/2022   CO2 24 06/20/2022   GLUCOSE 104 (H) 06/20/2022   BUN 25 (H) 06/20/2022   CREATININE 1.33 (H) 06/20/2022   CALCIUM 9.3 06/20/2022   MG 2.3 02/06/2022   Lab Results  Component Value Date   INR 1.02 05/22/2014   Lab Results  Component Value Date   CHOL  03/09/2008    169        ATP III CLASSIFICATION:  <200     mg/dL   Desirable  200-239  mg/dL   Borderline High  >=240    mg/dL   High   HDL 31 (L) 03/09/2008   LDLCALC (H) 03/09/2008    106        Total Cholesterol/HDL:CHD Risk Coronary Heart Disease Risk Table                     Men   Women  1/2 Average Risk   3.4   3.3   TRIG 159 (H) 03/09/2008     GEN- The patient is well appearing, alert and oriented x 3 today.   Head- normocephalic, atraumatic Eyes-  Sclera clear, conjunctiva pink Ears- hearing intact Oropharynx- clear Neck- supple, no JVP Lymph- no cervical lymphadenopathy Lungs- Clear to ausculation bilaterally, normal work of breathing Heart-irregular rate and rhythm, no murmurs, rubs or gallops, PMI not laterally displaced GI- soft, NT, ND, + BS Extremities- no clubbing, cyanosis, or edema MS- no significant deformity or atrophy Skin- no rash or lesion Psych- euthymic mood, full affect Neuro- strength and sensation are  intact  EKG- Vent. rate 60 BPM PR interval * ms QRS duration 74 ms QT/QTcB 372/372 ms P-R-T axes * 180 41  Suspect arm lead reversal, interpretation assumes no reversal Atrial fibrillation Septal infarct , age undetermined Lateral infarct , age undetermined Abnormal ECG When compared with ECG of 11-Jul-2022 15:25, PREVIOUS ECG  IS PRESENT  Echo-1. Left ventricular ejection fraction, by estimation, is 60 to 65%. Left  ventricular ejection fraction by 3D volume is 65 %. The left ventricle has  normal function. The left ventricle has no regional wall motion  abnormalities. There is mild asymmetric  left ventricular hypertrophy of the basal-septal segment. Left ventricular  diastolic function could not be evaluated.   2. Right ventricular systolic function is normal. The right ventricular  size is normal. There is normal pulmonary artery systolic pressure. The  estimated right ventricular systolic pressure is [id number] mmHg.   3. Left atrial size was moderately dilated.   4. The mitral valve is grossly normal. Mild to moderate mitral valve  regurgitation.   5. The aortic valve is tricuspid. Aortic valve regurgitation is not  visualized. No aortic stenosis is present.   6. Aortic dilatation noted. There is borderline dilatation of the aortic  root, measuring 38 mm.   7. The inferior vena cava is normal in size with greater than 50%  respiratory variability, suggesting right atrial pressure of 3 mmHg.   Comparison(s): No prior Echocardiogram.    Assessment and Plan:  1. Persistent afib S/p ablation 07/11/21 and returned to afib 07/24/22 We discussed that return of afib in the first 3 months is not uncommon and does not mean the procedure was not  successful. We would like for him to heal from the procedure in SR , so will schedule for cardioversion and he is in agreement, risk vrs benefit discussed  If afib persists outside the 3 month afib ablation window, then  repeat ablation  or  antiarrythmic's can be discussed at that time on f/u with Dr. Curt Bears 4/16  2. CHA2DS2VASc  score of 1 (age) Continue eliquis 5 mg bid States no missed doses for the last 3 weeks   F/u one week after the cardioversion   Butch Penny C. Markian Glockner, [facility] [address] [place], [place] [address] [phone]

## 2022-08-13 ENCOUNTER — Other Ambulatory Visit: Payer: Self-pay | Admitting: Internal Medicine

## 2022-08-13 ENCOUNTER — Ambulatory Visit: Payer: Medicare Other | Admitting: Dermatology

## 2022-08-13 NOTE — Telephone Encounter (Signed)
Prescription refill request for Eliquis received. Indication: Afib  Last office visit: 08/12/22 ([place])  Scr: 1.06 (08/12/22)  Age: 67 Weight: 94.8kg  Appropriate dose. Refill set.

## 2022-08-14 ENCOUNTER — Encounter (HOSPITAL_COMMUNITY)
Admission: RE | Disposition: A | Discharge status: Home / Self Care | Payer: Self-pay | Source: Home / Self Care | Attending: Cardiology

## 2022-08-14 ENCOUNTER — Ambulatory Visit (HOSPITAL_COMMUNITY): Payer: Medicare Other | Admitting: Anesthesiology

## 2022-08-14 ENCOUNTER — Ambulatory Visit (HOSPITAL_COMMUNITY)
Admission: RE | Admit: 2022-08-14 | Discharge: 2022-08-14 | Disposition: A | Discharge status: Home / Self Care | Payer: Medicare Other | Attending: Cardiology | Admitting: Cardiology

## 2022-08-14 ENCOUNTER — Encounter (HOSPITAL_COMMUNITY): Payer: Self-pay | Admitting: Cardiology

## 2022-08-14 ENCOUNTER — Other Ambulatory Visit: Payer: Self-pay

## 2022-08-14 ENCOUNTER — Ambulatory Visit (HOSPITAL_BASED_OUTPATIENT_CLINIC_OR_DEPARTMENT_OTHER): Payer: Medicare Other | Admitting: Anesthesiology

## 2022-08-14 DIAGNOSIS — Z6831 Body mass index (BMI) 31.0-31.9, adult: Secondary | ICD-10-CM | POA: Diagnosis not present

## 2022-08-14 DIAGNOSIS — Z8249 Family history of ischemic heart disease and other diseases of the circulatory system: Secondary | ICD-10-CM | POA: Insufficient documentation

## 2022-08-14 DIAGNOSIS — I10 Essential (primary) hypertension: Secondary | ICD-10-CM | POA: Diagnosis not present

## 2022-08-14 DIAGNOSIS — E669 Obesity, unspecified: Secondary | ICD-10-CM | POA: Diagnosis not present

## 2022-08-14 DIAGNOSIS — I4891 Unspecified atrial fibrillation: Secondary | ICD-10-CM

## 2022-08-14 DIAGNOSIS — Z6830 Body mass index (BMI) 30.0-30.9, adult: Secondary | ICD-10-CM | POA: Diagnosis not present

## 2022-08-14 DIAGNOSIS — I4819 Other persistent atrial fibrillation: Secondary | ICD-10-CM

## 2022-08-14 DIAGNOSIS — Z85820 Personal history of malignant melanoma of skin: Secondary | ICD-10-CM | POA: Insufficient documentation

## 2022-08-14 DIAGNOSIS — Z7901 Long term (current) use of anticoagulants: Secondary | ICD-10-CM | POA: Insufficient documentation

## 2022-08-14 HISTORY — PX: CARDIOVERSION: SHX1299

## 2022-08-14 SURGERY — CARDIOVERSION
Anesthesia: General

## 2022-08-14 MED ORDER — PROPOFOL 10 MG/ML IV BOLUS
INTRAVENOUS | Status: DC | PRN
  Administered 2022-08-14: 80 mg via INTRAVENOUS

## 2022-08-14 MED ORDER — SODIUM CHLORIDE 0.9 % IV SOLN: INTRAVENOUS | Status: DC

## 2022-08-14 MED ORDER — LIDOCAINE 2% (20 MG/ML) 5 ML SYRINGE
INTRAMUSCULAR | Status: DC | PRN
  Administered 2022-08-14: 100 mg via INTRAVENOUS

## 2022-08-14 NOTE — Anesthesia Preprocedure Evaluation (Signed)
Anesthesia Evaluation  Patient identified by MRN, date of birth, ID band Patient awake    Reviewed: Allergy & Precautions, H&P , NPO status , Patient's Chart, lab work & pertinent test results  History of Anesthesia Complications (+) PONV and history of anesthetic complications  Airway Mallampati: II  TM Distance: >3 FB Neck ROM: Full    Dental no notable dental hx. (+) Teeth Intact, Caps   Pulmonary neg pulmonary ROS   Pulmonary exam normal breath sounds clear to auscultation       Cardiovascular Exercise Tolerance: Good hypertension, Pt. on medications negative cardio ROS + dysrhythmias Atrial Fibrillation  Rhythm:Irregular Rate:Normal     Neuro/Psych negative neurological ROS  negative psych ROS   GI/Hepatic negative GI ROS, Neg liver ROS,,,  Endo/Other  negative endocrine ROS  Obesity  Renal/GU negative Renal ROS  negative genitourinary   Musculoskeletal negative musculoskeletal ROS (+) Arthritis , Osteoarthritis,  Hx/o melanoma chest   Abdominal  (+) + obese  Peds negative pediatric ROS (+)  Hematology negative hematology ROS (+) Eliquis therapy- last dose   Anesthesia Other Findings   Reproductive/Obstetrics negative OB ROS                             Anesthesia Physical Anesthesia Plan  ASA: 3  Anesthesia Plan: General   Post-op Pain Management: Minimal or no pain anticipated   Induction: Intravenous  PONV Risk Score and Plan: 3 and Treatment may vary due to age or medical condition  Airway Management Planned: Mask and Natural Airway  Additional Equipment: None  Intra-op Plan:   Post-operative Plan:   Informed Consent: I have reviewed the patients History and Physical, chart, labs and discussed the procedure including the risks, benefits and alternatives for the proposed anesthesia with the patient or authorized representative who has indicated his/her  understanding and acceptance.     Dental advisory given  Plan Discussed with: CRNA and Anesthesiologist  Anesthesia Plan Comments:         Anesthesia Quick Evaluation

## 2022-08-14 NOTE — Discharge Instructions (Signed)

## 2022-08-14 NOTE — Interval H&P Note (Signed)
History and Physical Interval Note:  08/14/2022 9:56 AM  Lawrence Compton  has presented today for surgery, with the diagnosis of AFIB.  The various methods of treatment have been discussed with the patient and family. After consideration of risks, benefits and other options for treatment, the patient has consented to  Procedure(s): CARDIOVERSION (N/A) as a surgical intervention.  The patient's history has been reviewed, patient examined, no change in status, stable for surgery.  I have reviewed the patient's chart and labs.  Questions were answered to the patient's satisfaction.     Fransico Him

## 2022-08-14 NOTE — Transfer of Care (Signed)
Immediate Anesthesia Transfer of Care Note  Patient: Lawrence Compton  Procedure(s) Performed: CARDIOVERSION  Patient Location: Endoscopy Unit  Anesthesia Type:General  Level of Consciousness: awake, alert , and oriented  Airway & Oxygen Therapy: Patient Spontanous Breathing  Post-op Assessment: Report given to RN and Post -op Vital signs reviewed and stable  Post vital signs: Reviewed and stable  Last Vitals:  Vitals Value Taken Time  BP    Temp    Pulse    Resp    SpO2      Last Pain:  Vitals:   08/14/22 0935  TempSrc: Temporal         Complications: No notable events documented.

## 2022-08-14 NOTE — Anesthesia Postprocedure Evaluation (Signed)
Anesthesia Post Note  Patient: Lawrence Compton  Procedure(s) Performed: CARDIOVERSION     Patient location during evaluation: PACU Anesthesia Type: General Level of consciousness: awake and alert Pain management: pain level controlled Vital Signs Assessment: post-procedure vital signs reviewed and stable Respiratory status: spontaneous breathing, nonlabored ventilation, respiratory function stable and patient connected to nasal cannula oxygen Cardiovascular status: blood pressure returned to baseline and stable Postop Assessment: no apparent nausea or vomiting Anesthetic complications: no   No notable events documented.  Last Vitals:  Vitals:   08/14/22 1017 08/14/22 1027  BP: 119/85 115/89  Pulse: 64 64  Resp: 20 13  Temp:    SpO2: 96% 95%    Last Pain:  Vitals:   08/14/22 1027  TempSrc:   PainSc: 0-No pain                 Jaze Rodino

## 2022-08-14 NOTE — CV Procedure (Signed)
    Electrical Cardioversion Procedure Note MOUSTAPHA TOOKER [id number] 05-Aug-1955  Procedure: Electrical Cardioversion Indications:  Atrial Fibrillation  Time Out: Verified patient identification, verified procedure,medications/allergies/relevent history reviewed, required imaging and test results available.  Performed  Procedure Details  The patient was NPO after midnight. Anesthesia was administered at the beside  by Dr.Oddono with '80mg'$  of propofol and '100mg'$  of Lidocaine.  Cardioversion was done with synchronized biphasic defibrillation with AP pads with 150watts.  The patient converted to normal sinus rhythm. The patient tolerated the procedure well   IMPRESSION:  Successful cardioversion of atrial fibrillation    Jamarea Selner 08/14/2022, 9:56 AM

## 2022-08-17 ENCOUNTER — Encounter (HOSPITAL_COMMUNITY): Payer: Self-pay | Admitting: Cardiology

## 2022-08-21 ENCOUNTER — Encounter (HOSPITAL_COMMUNITY): Payer: Self-pay | Admitting: Nurse Practitioner

## 2022-08-21 ENCOUNTER — Ambulatory Visit (HOSPITAL_COMMUNITY)
Admission: RE | Admit: 2022-08-21 | Discharge: 2022-08-21 | Disposition: A | Discharge status: Home / Self Care | Payer: Medicare Other | Source: Ambulatory Visit | Attending: Physician Assistant | Admitting: Physician Assistant

## 2022-08-21 VITALS — BP 128/88 | HR 68 | Ht 68.0 in | Wt 207.6 lb

## 2022-08-21 DIAGNOSIS — I4819 Other persistent atrial fibrillation: Secondary | ICD-10-CM | POA: Diagnosis not present

## 2022-08-21 DIAGNOSIS — D6869 Other thrombophilia: Secondary | ICD-10-CM

## 2022-08-21 DIAGNOSIS — Z7901 Long term (current) use of anticoagulants: Secondary | ICD-10-CM | POA: Diagnosis not present

## 2022-08-21 DIAGNOSIS — I480 Paroxysmal atrial fibrillation: Secondary | ICD-10-CM

## 2022-08-21 NOTE — Progress Notes (Signed)
Primary Care Physician: Kristen Loader, FNP Referring Physician: Dr. Wille Glaser III is a 67 y.o. male with a h/o afib with CVR that saw Dr. Curt Bears in September and was scheduled for an ablation pending 07/11/21. He reports he continues to be highly asymptomatic and well functioning with his afib. He usually will play pickle ball in the am and then go to the gym in the afternoons. He does notice a slow heart beat at times,when at rest, non sustained in the 30's- 40's that will increase back to  normal range with activity.   His health is unchanged since he saw Dr. Curt Bears last and no recent illness. Weight is stable, he is not prone to retain fluid.   F/u in afib clinic, 08/12/22. He is s/p ablation 07/11/22. He feels he went back into afib on 1/25. He has not felt bad with afib and it is rate controlled. He had been going to the gym and carrying out usual activities.  No missed anticoagulation and I discussed pursing a cardioversion which he is in agreement.   F/u in afib clinic, 08/21/22. He had a successful cardioversion and remains in SR today.   Today, he denies symptoms of palpitations, chest pain, shortness of breath, orthopnea, PND, lower extremity edema, dizziness, presyncope, syncope, or neurologic sequela. The patient is tolerating medications without difficulties and is otherwise without complaint today.   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   Left Chest tx cx3 58f   PONV (postoperative nausea and vomiting)    Past Surgical History:  Procedure Laterality Date   ACL implant     right knee    ATRIAL FIBRILLATION ABLATION  N/A 07/11/2022   Procedure: ATRIAL FIBRILLATION ABLATION;  Surgeon: CConstance Haw MD;  [facility];  Service: Cardiovascular;  Laterality: N/A;   CARDIOVERSION N/A 08/21/2021   Procedure: CARDIOVERSION;  Surgeon: NAcie FredricksonPWonda Cheng MD;  [facility];  Service: Cardiovascular;  Laterality: N/A;   CARDIOVERSION N/A 08/14/2022   Procedure: CARDIOVERSION;  Surgeon: TSueanne Margarita MD;  [facility];  Service: Cardiovascular;  Laterality: N/A;   colonscopy      Excision of melanoma on chest wall  2013   TOTAL KNEE ARTHROPLASTY Right 05/29/2014   Procedure: RIGHT TOTAL KNEE ARTHROPLASTY WITH REMOVAL OF HARDWARE;  Surgeon: MMauri Pole MD;  [facility];  Service: Orthopedics;  Laterality: Right;    Current Outpatient Medications  Medication Sig Dispense Refill   acetaminophen (TYLENOL) 500 MG tablet Take 500-1,000 mg by mouth every 6 (six) hours as needed (pain).     atorvastatin (LIPITOR) 10 MG tablet Take 10 mg by mouth every evening.     diclofenac Sodium (VOLTAREN) 1 % GEL Apply 1 Application topically 2 (two) times daily as needed (knee pain).     diltiazem (CARDIZEM) 30 MG tablet Take 30 mg every 6 hours as needed for heart rate greater than 120 60 tablet 6   ELIQUIS 5 MG TABS tablet TAKE ONE TABLET BY MOUTH TWICE A DAY  180 tablet 3   lisinopril-hydrochlorothiazide (ZESTORETIC) 20-12.5 MG tablet Take 1 tablet by mouth in the morning.     tamsulosin (FLOMAX) 0.4 MG CAPS capsule Take 1 capsule (0.4 mg total) by mouth daily. 30 capsule 0   No current facility-administered medications for this encounter.    No Known Allergies  Social History   Socioeconomic History   Marital status: Married    Spouse name: Not on file   Number of children: Not on file   Years of education: Not on file   Highest education level: Not on file  Occupational History   Not on file  Tobacco Use   Smoking status: Never   Smokeless tobacco: Never  Vaping Use    Vaping Use: Not on file  Substance and Sexual Activity   Alcohol use: Yes    Comment: occas    Drug use: No   Sexual activity: Not on file  Other Topics Concern   Not on file  Social History Narrative   Not on file   Social Determinants of Health   Financial Resource Strain: Not on file  Food Insecurity: Not on file  Transportation Needs: Not on file  Physical Activity: Not on file  Stress: Not on file  Social Connections: Not on file  Intimate Partner Violence: Not on file    Family History  Problem Relation Age of Onset   Hypertension Mother    Atrial fibrillation Mother    Hypertension Father     ROS- All systems are reviewed and negative except as per the HPI above  Physical Exam: Vitals:   08/21/22 1029  BP: 128/88  Pulse: 68  Weight: 94.2 kg  Height: 5' 8"$  (1.727 m)   Wt Readings from Last 3 Encounters:  08/21/22 94.2 kg  08/14/22 90.7 kg  08/12/22 94.8 kg    Labs: Lab Results  Component Value Date   NA 135 08/12/2022   K 4.0 08/12/2022   CL 104 08/12/2022   CO2 22 08/12/2022   GLUCOSE 92 08/12/2022   BUN 15 08/12/2022   CREATININE 1.06 08/12/2022   CALCIUM 9.2 08/12/2022   MG 2.3 02/06/2022   Lab Results  Component Value Date   INR 1.02 05/22/2014   Lab Results  Component Value Date   CHOL  03/09/2008    169        ATP III CLASSIFICATION:  <200     mg/dL   Desirable  200-239  mg/dL   Borderline High  >=240    mg/dL   High   HDL 31 (L) 03/09/2008   LDLCALC (H) 03/09/2008    106        Total Cholesterol/HDL:CHD Risk Coronary Heart Disease Risk Table                     Men   Women  1/2 Average Risk   3.4   3.3   TRIG 159 (H) 03/09/2008     GEN- The patient is well appearing, alert and oriented x 3 today.   Head- normocephalic, atraumatic Eyes-  Sclera clear, conjunctiva pink Ears- hearing intact Oropharynx- clear Neck- supple, no JVP Lymph- no cervical lymphadenopathy Lungs- Clear to ausculation bilaterally, normal work  of breathing Heart-regular rate and rhythm, no murmurs, rubs or gallops, PMI not laterally displaced GI- soft, NT, ND, + BS Extremities- no clubbing, cyanosis, or edema MS- no significant deformity or atrophy Skin- no rash or lesion Psych- euthymic mood, full affect Neuro- strength and  sensation are intact  EKG-  Vent. rate 68 BPM PR interval 266 ms QRS duration 72 ms QT/QTcB 366/389 ms P-R-T axes 7 191 43 Sinus rhythm with 1st degree A-V block Septal infarct , age undetermined Lateral infarct , age undetermined Abnormal ECG When compared with ECG of 14-Aug-2022 10:20, PREVIOUS ECG IS PRESENT  Echo-1. Left ventricular ejection fraction, by estimation, is 60 to 65%. Left  ventricular ejection fraction by 3D volume is 65 %. The left ventricle has  normal function. The left ventricle has no regional wall motion  abnormalities. There is mild asymmetric  left ventricular hypertrophy of the basal-septal segment. Left ventricular  diastolic function could not be evaluated.   2. Right ventricular systolic function is normal. The right ventricular  size is normal. There is normal pulmonary artery systolic pressure. The  estimated right ventricular systolic pressure is [id number] mmHg.   3. Left atrial size was moderately dilated.   4. The mitral valve is grossly normal. Mild to moderate mitral valve  regurgitation.   5. The aortic valve is tricuspid. Aortic valve regurgitation is not  visualized. No aortic stenosis is present.   6. Aortic dilatation noted. There is borderline dilatation of the aortic  root, measuring 38 mm.   7. The inferior vena cava is normal in size with greater than 50%  respiratory variability, suggesting right atrial pressure of 3 mmHg.   Comparison(s): No prior Echocardiogram.    Assessment and Plan:  1. Persistent afib S/p ablation 07/11/21 and returned to afib 07/24/22 Successful cardioversion 2/15 and is in SR today   2. CHA2DS2VASc  score of 1  (age) Continue eliquis 5 mg bid    F/u with Dr. Curt Bears 10/13/21  Geroge Baseman. Giordan Fordham, [facility] [address] [place],  [address] [phone]

## 2022-08-25 DIAGNOSIS — Z125 Encounter for screening for malignant neoplasm of prostate: Secondary | ICD-10-CM | POA: Diagnosis not present

## 2022-08-26 DIAGNOSIS — L570 Actinic keratosis: Secondary | ICD-10-CM | POA: Diagnosis not present

## 2022-08-26 DIAGNOSIS — L814 Other melanin hyperpigmentation: Secondary | ICD-10-CM | POA: Diagnosis not present

## 2022-08-26 DIAGNOSIS — Z85820 Personal history of malignant melanoma of skin: Secondary | ICD-10-CM | POA: Diagnosis not present

## 2022-08-26 DIAGNOSIS — L821 Other seborrheic keratosis: Secondary | ICD-10-CM | POA: Diagnosis not present

## 2022-08-26 DIAGNOSIS — D2262 Melanocytic nevi of left upper limb, including shoulder: Secondary | ICD-10-CM | POA: Diagnosis not present

## 2022-09-01 DIAGNOSIS — N5201 Erectile dysfunction due to arterial insufficiency: Secondary | ICD-10-CM | POA: Diagnosis not present

## 2022-09-01 DIAGNOSIS — Z125 Encounter for screening for malignant neoplasm of prostate: Secondary | ICD-10-CM | POA: Diagnosis not present

## 2022-09-01 DIAGNOSIS — R351 Nocturia: Secondary | ICD-10-CM | POA: Diagnosis not present

## 2022-09-23 DIAGNOSIS — K080 Exfoliation of teeth due to systemic causes: Secondary | ICD-10-CM | POA: Diagnosis not present

## 2022-10-14 ENCOUNTER — Ambulatory Visit: Payer: Medicare Other | Attending: Cardiology | Admitting: Cardiology

## 2022-10-14 ENCOUNTER — Encounter: Payer: Self-pay | Admitting: Cardiology

## 2022-10-14 VITALS — BP 118/80 | HR 81 | Ht 68.0 in | Wt 204.0 lb

## 2022-10-14 DIAGNOSIS — I4819 Other persistent atrial fibrillation: Secondary | ICD-10-CM

## 2022-10-14 DIAGNOSIS — D6869 Other thrombophilia: Secondary | ICD-10-CM | POA: Diagnosis not present

## 2022-10-14 DIAGNOSIS — I10 Essential (primary) hypertension: Secondary | ICD-10-CM | POA: Diagnosis not present

## 2022-10-14 NOTE — Progress Notes (Signed)
Electrophysiology Office Note   Date:  10/14/2022   ID:  Lawrence Compton, DOB 04-09-1956, MRN [id number]  PCP:  Soundra Pilon, FNP  Cardiologist:  Carolan Clines Primary Electrophysiologist:  Aleene Swanner Jorja Loa, MD    Chief Complaint: AF   History of Present Illness: Lawrence Compton is a 66 y.o. male who is being seen today for the evaluation of AF at the request of Soundra Pilon, FNP. Presenting today for electrophysiology evaluation.  He has a history seen for hypertension and atrial fibrillation.  He had a cardioversion 08/21/2021.  He had symptoms of shortness of breath and fatigue and atrial fibrillation.  He is now status post ablation 07/11/2022.  He had more atrial fibrillation post ablation and is post cardioversion 08/14/2022.  Today, denies symptoms of palpitations, chest pain, shortness of breath, orthopnea, PND, lower extremity edema, claudication, dizziness, presyncope, syncope, bleeding, or neurologic sequela. The patient is tolerating medications without difficulties.  Since his heart version he has done well.  He has noted no further episodes of atrial fibrillation.  Able to do all of his daily activities without restriction.   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   Left Chest tx cx3 29fu   PONV (postoperative nausea and vomiting)    Past Surgical History:  Procedure Laterality Date   ACL implant     right knee    ATRIAL FIBRILLATION ABLATION N/A 07/11/2022   Procedure: ATRIAL FIBRILLATION ABLATION;  Surgeon: Regan Lemming, MD;  Location: [facility];  Service: Cardiovascular;  Laterality: N/A;   CARDIOVERSION N/A  08/21/2021   Procedure: CARDIOVERSION;  Surgeon: [name], MD;  Location: [facility] ENDOSCOPY;  Service: Cardiovascular;  Laterality: N/A;   CARDIOVERSION N/A 08/14/2022   Procedure: CARDIOVERSION;  Surgeon: Quintella Reichert, MD;  Location: [facility] ENDOSCOPY;  Service: Cardiovascular;  Laterality: N/A;   colonscopy      Excision of melanoma on chest wall  2013   TOTAL KNEE ARTHROPLASTY Right 05/29/2014   Procedure: RIGHT TOTAL KNEE ARTHROPLASTY WITH REMOVAL OF HARDWARE;  Surgeon: Shelda Pal, MD;  Location: [facility] ORS;  Service: Orthopedics;  Laterality: Right;     Current Outpatient Medications  Medication Sig Dispense Refill   acetaminophen (TYLENOL) 500 MG tablet Take 500-1,000 mg by mouth every 6 (six) hours as needed (pain).     atorvastatin (LIPITOR) 10 MG tablet Take 10 mg by mouth every evening.     diclofenac Sodium (VOLTAREN) 1 % GEL Apply 1 Application topically 2 (two) times daily as needed (knee pain).     diltiazem (CARDIZEM) 30 MG tablet Take 30 mg every 6 hours as needed for heart rate greater than 120 60 tablet 6   ELIQUIS 5 MG TABS tablet TAKE ONE TABLET BY MOUTH TWICE A DAY 180 tablet 3   lisinopril-hydrochlorothiazide (ZESTORETIC) 20-12.5 MG tablet Take 1 tablet by mouth in the morning.     tamsulosin (FLOMAX) 0.4 MG CAPS capsule Take 1 capsule (0.4 mg total) by mouth daily. 30 capsule 0   No current facility-administered medications for this  visit.    Allergies:   Patient has no known allergies.   Social History:  The patient  reports that he has never smoked. He has never used smokeless tobacco. He reports current alcohol use. He reports that he does not use drugs.   Family History:  The patient's family history includes Atrial fibrillation in his mother; Hypertension in his father and mother.   ROS:  Please see the history of present illness.   Otherwise, review of systems is positive for none.   All other systems are reviewed and negative.   PHYSICAL EXAM: VS:  BP  118/80   Pulse 81   Ht 5\' 8"  (1.727 m)   Wt 204 lb (92.5 kg)   SpO2 96%   BMI 31.02 kg/m  , BMI Body mass index is 31.02 kg/m. GEN: Well nourished, well developed, in no acute distress  HEENT: normal  Neck: no JVD, carotid bruits, or masses Cardiac: RRR; no murmurs, rubs, or gallops,no edema  Respiratory:  clear to auscultation bilaterally, normal work of breathing GI: soft, nontender, nondistended, + BS MS: no deformity or atrophy  Skin: warm and dry Neuro:  Strength and sensation are intact Psych: euthymic mood, full affect  EKG:  EKG is ordered today. Personal review of the ekg ordered shows sinus rhythm, PACs, rate 81  Recent Labs: 02/06/2022: ALT 22; Magnesium 2.3; TSH 2.370 08/12/2022: BUN 15; Creatinine, Ser 1.06; Hemoglobin 16.5; Platelets 181; Potassium 4.0; Sodium 135    Lipid Panel     Component Value Date/Time   CHOL  03/09/2008 0710    169        ATP Compton CLASSIFICATION:  <200     mg/dL   Desirable  563-875  mg/dL   Borderline High  >=643    mg/dL   High   TRIG 329 (H) 51/88/4166 0710   HDL 31 (L) 03/09/2008 0710   CHOLHDL 5.5 03/09/2008 0710   VLDL 32 03/09/2008 0710   LDLCALC (H) 03/09/2008 0710    106        Total Cholesterol/HDL:CHD Risk Coronary Heart Disease Risk Table                     Men   Women  1/2 Average Risk   3.4   3.3     Wt Readings from Last 3 Encounters:  10/14/22 204 lb (92.5 kg)  08/21/22 207 lb 9.6 oz (94.2 kg)  08/14/22 200 lb (90.7 kg)      Other studies Reviewed: Additional studies/ records that were reviewed today include: TTE 08/15/21  Review of the above records today demonstrates:   1. Left ventricular ejection fraction, by estimation, is 60 to 65%. Left  ventricular ejection fraction by 3D volume is 65 %. The left ventricle has  normal function. The left ventricle has no regional wall motion  abnormalities. There is mild asymmetric  left ventricular hypertrophy of the basal-septal segment. Left ventricular   diastolic function could not be evaluated.   2. Right ventricular systolic function is normal. The right ventricular  size is normal. There is normal pulmonary artery systolic pressure. The  estimated right ventricular systolic pressure is 25.7 mmHg.   3. Left atrial size was moderately dilated.   4. The mitral valve is grossly normal. Mild to moderate mitral valve  regurgitation.   5. The aortic valve is tricuspid. Aortic valve regurgitation is not  visualized. No aortic stenosis is present.   6. Aortic dilatation noted. There is  borderline dilatation of the aortic  root, measuring 38 mm.   7. The inferior vena cava is normal in size with greater than 50%  respiratory variability, suggesting right atrial pressure of 3 mmHg.   Cardiac monitor 03/10/2022 personally reviewed 100% atrial fibrillation burden 7 VT episodes, longest 16 beats at 144 bpm Less than 1% ventricular ectopy 142 pauses, longest 3.7 seconds Triggered episode associated with atrial fibrillation   Max 250 bpm 10:03am, 08/26 Min 29 bpm 04:48am, 08/18 Avg 58 bpm  ASSESSMENT AND PLAN:  1.  Persistent atrial fibrillation: CHA2DS2-VASc of 2.  Currently on Eliquis.  Status post ablation 07/11/2022.  He continued to have episodes of atrial fibrillation and post cardioversion 08/14/2022.  He has remained in sinus rhythm since his cardioversion.  He is doing well without complaint.  2.  Hypertension: Currently well-controlled  3.  Secondary hypercoagulable state: Currently on Eliquis for atrial fibrillation   Current medicines are reviewed at length with the patient today.   The patient does not have concerns regarding his medicines.  The following changes were made today:  none  Labs/ tests ordered today include:  Orders Placed This Encounter  Procedures   EKG 12-Lead     Disposition:   FU 6 months  Signed, Gaylyn Berish Jorja Loa, MD  10/14/2022 11:19 AM     [facility] [address] [place] [address] [phone] (office) [phone] (fax)

## 2022-10-14 NOTE — Patient Instructions (Signed)
Medication Instructions:  Your physician recommends that you continue on your current medications as directed. Please refer to the Current Medication list given to you today.  *If you need a refill on your cardiac medications before your next appointment, please call your pharmacy*   Lab Work: None ordered If you have labs (blood work) drawn today and your tests are completely normal, you will receive your results only by: . MyChart Message (if you have MyChart) OR . A paper copy in the mail If you have any lab test that is abnormal or we need to change your treatment, we will call you to review the results.   Testing/Procedures: None ordered   Follow-Up: At [facility], you and your health needs are our priority.  As part of our continuing mission to provide you with exceptional heart care, we have created designated Provider Care Teams.  These Care Teams include your primary Cardiologist (physician) and Advanced Practice Providers (APPs -  Physician Assistants and Nurse Practitioners) who all work together to provide you with the care you need, when you need it.  We recommend signing up for the patient portal called "MyChart".  Sign up information is provided on this After Visit Summary.  MyChart is used to connect with patients for Virtual Visits (Telemedicine).  Patients are able to view lab/test results, encounter notes, upcoming appointments, etc.  Non-urgent messages can be sent to your provider as well.   To learn more about what you can do with MyChart, go to [email or web address].    Your next appointment:   6 month(s)  The format for your next appointment:   In Person  Provider:   Will Camnitz, MD   Thank you for choosing [facility]!!   Alizah Sills, RN [phone]    Other Instructions    

## 2022-11-07 ENCOUNTER — Encounter: Payer: Self-pay | Admitting: Cardiology

## 2022-11-10 ENCOUNTER — Telehealth: Payer: Self-pay | Admitting: Cardiology

## 2022-11-10 NOTE — Telephone Encounter (Signed)
Patient c/o Palpitations:  High priority if patient c/o lightheadedness, shortness of breath, or chest pain  How long have you had palpitations/irregular HR/ Afib? Patient states he has been in A-fib since the 5/7 Are you having the symptoms now? Yes   Are you currently experiencing lightheadedness, SOB or CP? Patient states only at times he gets lightheaded.   Do you have a history of afib (atrial fibrillation) or irregular heart rhythm? Yes   Have you checked your BP or HR? (document readings if available): BP 120/80   Are you experiencing any other symptoms? No  Patient states he sent a Mychart message with three different readings from his [employer].

## 2022-11-11 NOTE — Telephone Encounter (Signed)
Pt already scheduled to see afib clinic this Friday, 5/17. Pt aware to keep this appt

## 2022-11-14 ENCOUNTER — Ambulatory Visit (HOSPITAL_COMMUNITY)
Admission: RE | Admit: 2022-11-14 | Discharge: 2022-11-14 | Disposition: A | Discharge status: Home / Self Care | Payer: Medicare Other | Source: Ambulatory Visit | Attending: Internal Medicine | Admitting: Internal Medicine

## 2022-11-14 ENCOUNTER — Telehealth: Payer: Self-pay | Admitting: Pharmacist

## 2022-11-14 VITALS — BP 130/84 | HR 60 | Ht 68.0 in | Wt 202.6 lb

## 2022-11-14 DIAGNOSIS — Z79899 Other long term (current) drug therapy: Secondary | ICD-10-CM | POA: Diagnosis not present

## 2022-11-14 DIAGNOSIS — I4819 Other persistent atrial fibrillation: Secondary | ICD-10-CM | POA: Insufficient documentation

## 2022-11-14 MED ORDER — LISINOPRIL 20 MG PO TABS: 20.0000 mg | ORAL_TABLET | Freq: Every day | ORAL | 3 refills | Status: DC

## 2022-11-14 NOTE — Progress Notes (Signed)
Primary Care Physician: Soundra Pilon, FNP Referring Physician: Dr. Doyle Askew III is a 67 y.o. male with a h/o afib with CVR that saw Dr. Elberta Fortis in September and was scheduled for an ablation pending 07/11/21. He reports he continues to be highly asymptomatic and well functioning with his afib. He usually will play pickle ball in the am and then go to the gym in the afternoons. He does notice a slow heart beat at times,when at rest, non sustained in the 30's- 40's that will increase back to  normal range with activity.   His health is unchanged since he saw Dr. Elberta Fortis last and no recent illness. Weight is stable, he is not prone to retain fluid.   F/u in afib clinic, 08/12/22. He is s/p ablation 07/11/22. He feels he went back into afib on 1/25. He has not felt bad with afib and it is rate controlled. He had been going to the gym and carrying out usual activities.  No missed anticoagulation and I discussed pursing a cardioversion which he is in agreement.   F/u in afib clinic, 08/21/22. He had a successful cardioversion and remains in SR today.   F/u in Afib clinic, 11/14/22. He is back in Afib since 5/7. He noted this via K[employer] device. Some shortness of breath but overall he is typically rate controlled when in Afib. He has never needed to take his prn diltiazem when in Afib. He is interested in staying in rhythm and hesitant to do another cardioversion without something else keeping him in normal rhythm. No missed doses of Eliquis 5 mg BID.   Today, he denies symptoms of palpitations, chest pain, shortness of breath, orthopnea, PND, lower extremity edema, dizziness, presyncope, syncope, or neurologic sequela. The patient is tolerating medications without difficulties and is otherwise without complaint today.   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   Left Chest tx cx3 73fu   PONV (postoperative nausea and vomiting)    Past Surgical History:  Procedure Laterality Date   ACL implant     right knee    ATRIAL FIBRILLATION ABLATION N/A 07/11/2022   Procedure: ATRIAL FIBRILLATION ABLATION;  Surgeon: Regan Lemming, MD;  Location: [facility] INVASIVE CV LAB;  Service: Cardiovascular;  Laterality: N/A;   CARDIOVERSION N/A 08/21/2021   Procedure: CARDIOVERSION;  Surgeon: [name], MD;  Location: [facility] ENDOSCOPY;  Service: Cardiovascular;  Laterality: N/A;   CARDIOVERSION N/A 08/14/2022   Procedure: CARDIOVERSION;  Surgeon: Quintella Reichert, MD;  Location: [facility] ENDOSCOPY;  Service: Cardiovascular;  Laterality: N/A;   colonscopy      Excision of melanoma on chest wall  2013   TOTAL KNEE ARTHROPLASTY Right 05/29/2014   Procedure: RIGHT TOTAL KNEE ARTHROPLASTY WITH REMOVAL OF HARDWARE;  Surgeon: Shelda Pal, MD;  Location: [facility] ORS;  Service: Orthopedics;  Laterality: Right;    Current Outpatient Medications  Medication Sig Dispense Refill   acetaminophen (TYLENOL) 500 MG tablet Take 500-1,000 mg by mouth every 6 (six) hours as needed (pain).  atorvastatin (LIPITOR) 10 MG tablet Take 10 mg by mouth every evening.     diclofenac Sodium (VOLTAREN) 1 % GEL Apply 1 Application topically 2 (two) times daily as needed (knee pain).     ELIQUIS 5 MG TABS tablet TAKE ONE TABLET BY MOUTH TWICE A DAY 180 tablet 3   lisinopril-hydrochlorothiazide (ZESTORETIC) 20-12.5 MG tablet Take 1 tablet by mouth in the morning.     tamsulosin (FLOMAX) 0.4 MG CAPS capsule Take 1 capsule (0.4 mg total) by mouth daily. 30 capsule 0   diltiazem (CARDIZEM) 30 MG tablet Take 30 mg every 6 hours as needed for heart rate greater than 120 (Patient not  taking: Reported on 11/14/2022) 60 tablet 6   No current facility-administered medications for this encounter.    No Known Allergies  Social History   Socioeconomic History   Marital status: Married    Spouse name: Not on file   Number of children: Not on file   Years of education: Not on file   Highest education level: Not on file  Occupational History   Not on file  Tobacco Use   Smoking status: Never   Smokeless tobacco: Never  Vaping Use   Vaping Use: Not on file  Substance and Sexual Activity   Alcohol use: Yes    Comment: occas    Drug use: No   Sexual activity: Not on file  Other Topics Concern   Not on file  Social History Narrative   Not on file   Social Determinants of Health   Financial Resource Strain: Not on file  Food Insecurity: Not on file  Transportation Needs: Not on file  Physical Activity: Not on file  Stress: Not on file  Social Connections: Not on file  Intimate Partner Violence: Not on file    Family History  Problem Relation Age of Onset   Hypertension Mother    Atrial fibrillation Mother    Hypertension Father     ROS- All systems are reviewed and negative except as per the HPI above  Physical Exam: Vitals:   11/14/22 0901  BP: 130/84  Pulse: 60  Weight: 91.9 kg  Height: 5\' 8"  (1.727 m)   Wt Readings from Last 3 Encounters:  11/14/22 91.9 kg  10/14/22 92.5 kg  08/21/22 94.2 kg    Labs: Lab Results  Component Value Date   NA 135 08/12/2022   K 4.0 08/12/2022   CL 104 08/12/2022   CO2 22 08/12/2022   GLUCOSE 92 08/12/2022   BUN 15 08/12/2022   CREATININE 1.06 08/12/2022   CALCIUM 9.2 08/12/2022   MG 2.3 02/06/2022   Lab Results  Component Value Date   INR 1.02 05/22/2014   Lab Results  Component Value Date   CHOL  03/09/2008    169        ATP III CLASSIFICATION:  <200     mg/dL   Desirable  696-295  mg/dL   Borderline High  >=284    mg/dL   High   HDL 31 (L) 13/24/4010   LDLCALC (H) 03/09/2008    106         Total Cholesterol/HDL:CHD Risk Coronary Heart Disease Risk Table                     Men   Women  1/2 Average Risk   3.4   3.3   TRIG 159 (H) 03/09/2008    GEN- The patient is well appearing, alert and oriented  x 3 today.   Head- normocephalic, atraumatic Eyes-  Sclera clear, conjunctiva pink Ears- hearing intact Lungs- Clear to ausculation bilaterally, normal work of breathing Heart- Irregular rate and rhythm, no murmurs, rubs or gallops, PMI not laterally displaced Extremities- no clubbing, cyanosis, or edema MS- no significant deformity or atrophy Skin- no rash or lesion Psych- euthymic mood, full affect Neuro- strength and sensation are intact   EKG-   Vent. rate 60 BPM PR interval * ms QRS duration 72 ms QT/QTcB 392/392 ms P-R-T axes * 172 53  Suspect arm lead reversal, interpretation assumes no reversal Atrial fibrillation Septal infarct , age undetermined Lateral infarct , age undetermined Abnormal ECG When compared with ECG of 21-Aug-2022 10:37, PREVIOUS ECG IS PRESENT  Echo- 08/15/21 1. Left ventricular ejection fraction, by estimation, is 60 to 65%. Left  ventricular ejection fraction by 3D volume is 65 %. The left ventricle has  normal function. The left ventricle has no regional wall motion  abnormalities. There is mild asymmetric  left ventricular hypertrophy of the basal-septal segment. Left ventricular  diastolic function could not be evaluated.   2. Right ventricular systolic function is normal. The right ventricular  size is normal. There is normal pulmonary artery systolic pressure. The  estimated right ventricular systolic pressure is 25.7 mmHg.   3. Left atrial size was moderately dilated.   4. The mitral valve is grossly normal. Mild to moderate mitral valve  regurgitation.   5. The aortic valve is tricuspid. Aortic valve regurgitation is not  visualized. No aortic stenosis is present.   6. Aortic dilatation noted. There is borderline  dilatation of the aortic  root, measuring 38 mm.   7. The inferior vena cava is normal in size with greater than 50%  respiratory variability, suggesting right atrial pressure of 3 mmHg.   Comparison(s): No prior Echocardiogram.    Assessment and Plan:  1. Persistent afib S/p ablation 07/11/21 and returned to afib 07/24/22 Successful cardioversion 08/14/22  He is in Afib today that is rate controlled. We discussed performing another cardioversion but this would be the 2nd DCCV since ablation in January. After discussion, agreed that at this point medication treatment would be reasonable to lower Afib burden. We discussed how amiodarone would be less favorable given his age. We discussed flecainide (coronary calcium score 1) and how it would require the addition of an AV nodal agent. We discussed Tikosyn and how it would require a 3 night admission for loading. After discussion of options, he would like to proceed with scheduling Tikosyn admission. I am in agreement with this.   Medication list pending pharmacist review - would need to transition off HCTZ.   Previous mag 2.3 in 01/2022. K 4.0 on 08/12/22. CrCl 88 mL/min using Bmet from 08/12/22 makes him eligible for 500 mcg BID dosage.  2. CHA2DS2VASc  score of 1 (age) Continue eliquis 5 mg bid    Will schedule for Tikosyn admission - he is out of town for most of June  L[place] Bells, PA-C Afib Clinic [facility] [address] [place], [place] [address] [phone]

## 2022-11-14 NOTE — Patient Instructions (Addendum)
On Friday, July 5th you will STOP lisinopril/HCTZ and switch to only Lisinopril 20mg  daily

## 2022-11-14 NOTE — Telephone Encounter (Signed)
Pt notified to stop hctz as of 3 days prior to admit. Lisinopril 20mg  called in to start

## 2022-11-14 NOTE — Telephone Encounter (Signed)
Medication list reviewed in anticipation of upcoming Tikosyn initiation. Patient is taking one contraindicated medication. He is not taking any QTc prolonging medications.   Patient will need to stop HCTZ at least 3 days prior to initiation of Tikosyn.  Patient is anticoagulated on Eliquis on the appropriate dose. Please ensure that patient has not missed any anticoagulation doses in the 3 weeks prior to Tikosyn initiation.   Patient will need to be counseled to avoid use of Benadryl while on Tikosyn and in the 2-3 days prior to Tikosyn initiation.

## 2023-01-02 ENCOUNTER — Encounter (HOSPITAL_COMMUNITY): Payer: Self-pay

## 2023-01-06 ENCOUNTER — Inpatient Hospital Stay (HOSPITAL_COMMUNITY)
Admission: AD | Admit: 2023-01-06 | Discharge: 2023-01-09 | DRG: 310 | Disposition: A | Discharge status: Home / Self Care | Payer: Medicare Other | Source: Ambulatory Visit | Attending: Cardiology | Admitting: Cardiology

## 2023-01-06 ENCOUNTER — Ambulatory Visit (HOSPITAL_COMMUNITY)
Admission: RE | Admit: 2023-01-06 | Discharge: 2023-01-06 | Disposition: A | Discharge status: Home / Self Care | Payer: Medicare Other | Source: Ambulatory Visit | Attending: Internal Medicine | Admitting: Internal Medicine

## 2023-01-06 ENCOUNTER — Other Ambulatory Visit (HOSPITAL_COMMUNITY): Payer: Self-pay

## 2023-01-06 ENCOUNTER — Encounter (HOSPITAL_COMMUNITY): Payer: Self-pay | Admitting: Internal Medicine

## 2023-01-06 VITALS — BP 124/82 | HR 80 | Ht 68.0 in | Wt 203.8 lb

## 2023-01-06 DIAGNOSIS — I4819 Other persistent atrial fibrillation: Principal | ICD-10-CM | POA: Diagnosis present

## 2023-01-06 DIAGNOSIS — Z79899 Other long term (current) drug therapy: Secondary | ICD-10-CM

## 2023-01-06 DIAGNOSIS — I480 Paroxysmal atrial fibrillation: Secondary | ICD-10-CM | POA: Diagnosis not present

## 2023-01-06 DIAGNOSIS — I4892 Unspecified atrial flutter: Secondary | ICD-10-CM | POA: Diagnosis not present

## 2023-01-06 DIAGNOSIS — Z7901 Long term (current) use of anticoagulants: Secondary | ICD-10-CM

## 2023-01-06 DIAGNOSIS — Z85828 Personal history of other malignant neoplasm of skin: Secondary | ICD-10-CM | POA: Diagnosis not present

## 2023-01-06 DIAGNOSIS — I4891 Unspecified atrial fibrillation: Secondary | ICD-10-CM | POA: Diagnosis not present

## 2023-01-06 DIAGNOSIS — I10 Essential (primary) hypertension: Secondary | ICD-10-CM | POA: Diagnosis present

## 2023-01-06 DIAGNOSIS — M1990 Unspecified osteoarthritis, unspecified site: Secondary | ICD-10-CM | POA: Diagnosis not present

## 2023-01-06 DIAGNOSIS — Z96651 Presence of right artificial knee joint: Secondary | ICD-10-CM | POA: Diagnosis present

## 2023-01-06 DIAGNOSIS — Z85820 Personal history of malignant melanoma of skin: Secondary | ICD-10-CM | POA: Diagnosis not present

## 2023-01-06 LAB — MAGNESIUM: Magnesium: 2.1 mg/dL (ref 1.7–2.4)

## 2023-01-06 LAB — BASIC METABOLIC PANEL
Anion gap: 9 (ref 5–15)
BUN: 17 mg/dL (ref 8–23)
CO2: 20 mmol/L — ABNORMAL LOW (ref 22–32)
Calcium: 9.3 mg/dL (ref 8.9–10.3)
Chloride: 111 mmol/L (ref 98–111)
Creatinine, Ser: 1.26 mg/dL — ABNORMAL HIGH (ref 0.61–1.24)
GFR, Estimated: >60 mL/min (ref >60)
Glucose, Bld: 101 mg/dL — ABNORMAL HIGH (ref 70–99)
Potassium: 3.9 mmol/L (ref 3.5–5.1)
Sodium: 140 mmol/L (ref 135–145)

## 2023-01-06 MED ORDER — POTASSIUM CHLORIDE CRYS ER 20 MEQ PO TBCR
40.0000 meq | EXTENDED_RELEASE_TABLET | Freq: Once | ORAL | Status: AC
  Administered 2023-01-06: 40 meq via ORAL
  Filled 2023-01-06: qty 2

## 2023-01-06 MED ORDER — DOFETILIDE 500 MCG PO CAPS
500.0000 ug | ORAL_CAPSULE | Freq: Two times a day (BID) | ORAL | Status: DC
  Administered 2023-01-06 – 2023-01-09 (×6): 500 ug via ORAL
  Filled 2023-01-06 (×6): qty 1

## 2023-01-06 MED ORDER — SODIUM CHLORIDE 0.9 % IV SOLN: 250.0000 mL | INTRAVENOUS | Status: DC | PRN

## 2023-01-06 MED ORDER — APIXABAN 5 MG PO TABS
5.0000 mg | ORAL_TABLET | Freq: Two times a day (BID) | ORAL | Status: DC
  Administered 2023-01-06 – 2023-01-09 (×6): 5 mg via ORAL
  Filled 2023-01-06 (×6): qty 1

## 2023-01-06 MED ORDER — SODIUM CHLORIDE 0.9% FLUSH: 3.0000 mL | INTRAVENOUS | Status: DC | PRN

## 2023-01-06 MED ORDER — SODIUM CHLORIDE 0.9% FLUSH
3.0000 mL | Freq: Two times a day (BID) | INTRAVENOUS | Status: DC
  Administered 2023-01-06 – 2023-01-09 (×7): 3 mL via INTRAVENOUS

## 2023-01-06 MED ORDER — ACETAMINOPHEN 500 MG PO TABS
500.0000 mg | ORAL_TABLET | Freq: Four times a day (QID) | ORAL | Status: DC | PRN
  Administered 2023-01-09: 1000 mg via ORAL
  Filled 2023-01-06: qty 2

## 2023-01-06 MED ORDER — TAMSULOSIN HCL 0.4 MG PO CAPS
0.4000 mg | ORAL_CAPSULE | Freq: Every day | ORAL | Status: DC
  Administered 2023-01-07 – 2023-01-09 (×3): 0.4 mg via ORAL
  Filled 2023-01-06 (×3): qty 1

## 2023-01-06 MED ORDER — ATORVASTATIN CALCIUM 10 MG PO TABS
10.0000 mg | ORAL_TABLET | Freq: Every evening | ORAL | Status: DC
  Administered 2023-01-06 – 2023-01-08 (×3): 10 mg via ORAL
  Filled 2023-01-06 (×3): qty 1

## 2023-01-06 MED ORDER — LISINOPRIL 20 MG PO TABS
20.0000 mg | ORAL_TABLET | Freq: Every day | ORAL | Status: DC
  Administered 2023-01-07 – 2023-01-09 (×3): 20 mg via ORAL
  Filled 2023-01-06 (×3): qty 1

## 2023-01-06 NOTE — Progress Notes (Signed)
Pharmacy: Dofetilide (Tikosyn) - Initial Consult Assessment and Electrolyte Replacement  Pharmacy consulted to assist in monitoring and replacing electrolytes in this 67 y.o. male admitted on 01/06/2023 undergoing dofetilide initiation. First dofetilide dose: 01/06/23  Assessment:  Patient Exclusion Criteria: If any screening criteria checked as "Yes", then  patient  should NOT receive dofetilide until criteria item is corrected.  If "Yes" please indicate correction plan.  YES  NO Patient  Exclusion Criteria Correction Plan   []   [x]   Baseline QTc interval is greater than or equal to 440 msec. IF above YES box checked dofetilide contraindicated unless patient has ICD; then may proceed if QTc 500-550 msec or with known ventricular conduction abnormalities may proceed with QTc 550-600 msec. QTc = 411    []   [x]   Patient is known or suspected to have a digoxin level greater than 2 ng/ml: No results found for: "DIGOXIN"     []   [x]   Creatinine clearance less than 20 ml/min (calculated using Cockcroft-Gault, actual body weight and serum creatinine): CrCl cannot be calculated (Patient's most recent lab result is older than the maximum 21 days allowed.).     []   [x]  Patient has received drugs known to prolong the QT intervals within the last 48 hours (phenothiazines, tricyclics or tetracyclic antidepressants, erythromycin, H-1 antihistamines, cisapride, fluoroquinolones, azithromycin, ondansetron).   Updated information on QT prolonging agents is available to be searched on the following database:QT prolonging agents     []   [x]   Patient received a dose of hydrochlorothiazide (Oretic) alone or in any combination including triamterene (Dyazide, Maxzide) in the last 48 hours. Hydrochlorothiazide stopped previously   []   [x]  Patient received a medication known to increase dofetilide plasma concentrations prior to initial dofetilide dose:  Trimethoprim (Primsol, Proloprim) in the last 36  hours Verapamil (Calan, Verelan) in the last 36 hours or a sustained release dose in the last 72 hours Megestrol (Megace) in the last 5 days  Cimetidine (Tagamet) in the last 6 hours Ketoconazole (Nizoral) in the last 24 hours Itraconazole (Sporanox) in the last 48 hours  Prochlorperazine (Compazine) in the last 36 hours     []   [x]   Patient is known to have a history of torsades de pointes; congenital or acquired long QT syndromes.    []   [x]   Patient has received a Class 1 antiarrhythmic with less than 2 half-lives since last dose. (Disopyramide, Quinidine, Procainamide, Lidocaine, Mexiletine, Flecainide, Propafenone)    []   [x]   Patient has received amiodarone therapy in the past 3 months or amiodarone level is greater than 0.3 ng/ml.    Labs:    Component Value Date/Time   K 3.9 01/06/2023 1130   MG 2.1 01/06/2023 1130     Plan: [facility] Calculated CrCl  Dose q12h  [x]  > 60 ml/min 500 mcg  []  40-60 ml/min 250 mcg  []  20-40 ml/min 125 mcg   [x]   Physician selected initial dose within range recommended for patients level of renal function - will monitor for response.  []   Physician selected initial dose outside of range recommended for patients level of renal function - will discuss if the dose should be altered at this time.   Patient has been appropriately anticoagulated with apixaban 5mg  BID.  Potassium: K 3.8-3.9:  Hold Tikosyn initiation and give KCl 40 mEq po x1 then begin Tikosyn at least 2hr after KCl dose - do not need to recheck K   Magnesium: Mg >2: Appropriate to initiate Tikosyn, no replacement  needed     Thank you for allowing pharmacy to participate in this patient's care   Mosetta Anis 01/06/2023  1:21 PM

## 2023-01-06 NOTE — TOC Benefit Eligibility Note (Signed)
Pharmacy Patient Advocate Encounter  Insurance verification completed.    The patient is insured through [facility]   Ran test claim for dofetilide (Tikosyn) 500 mcg and the current 30 day co-pay is $235.81 due to a $150.00 deductible.  Will be $85.81 once deductible is met.   This test claim was processed through [facility]- copay amounts may vary at other pharmacies due to pharmacy/plan contracts, or as the patient moves through the different stages of their insurance plan.    Roland Earl, CPHT Pharmacy Patient Advocate Specialist [facility] Pharmacy Patient Advocate Team Direct Number: [phone]  Fax: [phone]

## 2023-01-06 NOTE — Progress Notes (Addendum)
Primary Care Physician: Soundra Pilon, FNP Referring Physician: Dr. Doyle Askew III is a 67 y.o. male with a h/o afib with CVR that saw Dr. Elberta Fortis in September and was scheduled for an ablation pending 07/11/21. He reports he continues to be highly asymptomatic and well functioning with his afib. He usually will play pickle ball in the am and then go to the gym in the afternoons. He does notice a slow heart beat at times,when at rest, non sustained in the 30's- 40's that will increase back to  normal range with activity.   His health is unchanged since he saw Dr. Elberta Fortis last and no recent illness. Weight is stable, he is not prone to retain fluid.   F/u in afib clinic, 08/12/22. He is s/p ablation 07/11/22. He feels he went back into afib on 1/25. He has not felt bad with afib and it is rate controlled. He had been going to the gym and carrying out usual activities.  No missed anticoagulation and I discussed pursing a cardioversion which he is in agreement.   F/u in afib clinic, 08/21/22. He had a successful cardioversion and remains in SR today.   F/u in Afib clinic, 11/14/22. He is back in Afib since 5/7. He noted this via Kardiamobile device. Some shortness of breath but overall he is typically rate controlled when in Afib. He has never needed to take his prn diltiazem when in Afib. He is interested in staying in rhythm and hesitant to do another cardioversion without something else keeping him in normal rhythm. No missed doses of Eliquis 5 mg BID.   F/u in Afib clinic, 01/06/23. Patient presents today for Tikosyn admission. He is currently in rate controlled Afib. He has stopped Zestoretic as of this past Friday. No benadryl. No missed doses of Eliquis. No new medications since pharmacist review.   Today, he denies symptoms of palpitations, chest pain, shortness of breath, orthopnea, PND, lower extremity edema, dizziness, presyncope, syncope, or neurologic sequela. The patient is  tolerating medications without difficulties and is otherwise without complaint today.   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   Left Chest tx cx3 62fu   PONV (postoperative nausea and vomiting)    Past Surgical History:  Procedure Laterality Date   ACL implant     right knee    ATRIAL FIBRILLATION ABLATION N/A 07/11/2022   Procedure: ATRIAL FIBRILLATION ABLATION;  Surgeon: Regan Lemming, MD;  Location: [facility] INVASIVE CV LAB;  Service: Cardiovascular;  Laterality: N/A;   CARDIOVERSION N/A 08/21/2021   Procedure: CARDIOVERSION;  Surgeon: [name], MD;  Location[facility] ENDOSCOPY;  Service: Cardiovascular;  Laterality: N/A;   CARDIOVERSION N/A 08/14/2022   Procedure: CARDIOVERSION;  Surgeon: Quintella Reichert, MD;  Location: [facility] ENDOSCOPY;  Service: Cardiovascular;  Laterality: N/A;   colonscopy      Excision of melanoma on chest wall  2013   TOTAL KNEE ARTHROPLASTY Right 05/29/2014   Procedure: RIGHT TOTAL KNEE ARTHROPLASTY WITH REMOVAL OF HARDWARE;  Surgeon: Shelda Pal, MD;  Location:  [facility] ORS;  Service: Orthopedics;  Laterality: Right;    Current Outpatient Medications  Medication Sig Dispense Refill   acetaminophen (TYLENOL) 500 MG tablet Take 500-1,000 mg by mouth every 6 (six) hours as needed (pain).     atorvastatin (LIPITOR) 10 MG tablet Take 10 mg by mouth every evening. Pt takes at 7 pm     diclofenac Sodium (VOLTAREN) 1 % GEL Apply 1 Application topically 2 (two) times daily as needed (knee pain).     diltiazem (CARDIZEM) 30 MG tablet Take 30 mg every 6 hours as needed for heart rate greater than 120 60 tablet 6   ELIQUIS 5 MG TABS  tablet TAKE ONE TABLET BY MOUTH TWICE A DAY (Patient taking differently: Take 5 mg by mouth 2 (two) times daily. Pt takes at 7 am and 7 pm) 180 tablet 3   lisinopril (ZESTRIL) 20 MG tablet Take 1 tablet (20 mg total) by mouth daily. (Patient taking differently: Take 20 mg by mouth daily. Pt takes at 7 am) 30 tablet 3   tamsulosin (FLOMAX) 0.4 MG CAPS capsule Take 1 capsule (0.4 mg total) by mouth daily. (Patient taking differently: Take 0.4 mg by mouth daily. Pt takes at 7 am) 30 capsule 0   No current facility-administered medications for this encounter.    No Known Allergies  ROS- All systems are reviewed and negative except as per the HPI above  Physical Exam: Vitals:   01/06/23 1129  BP: 124/82  Pulse: 80  Weight: 92.4 kg  Height: 5\' 8"  (1.727 m)    Wt Readings from Last 3 Encounters:  01/06/23 92.4 kg  11/14/22 91.9 kg  10/14/22 92.5 kg    Labs: Lab Results  Component Value Date   NA 135 08/12/2022   K 4.0 08/12/2022   CL 104 08/12/2022   CO2 22 08/12/2022   GLUCOSE 92 08/12/2022   BUN 15 08/12/2022   CREATININE 1.06 08/12/2022   CALCIUM 9.2 08/12/2022   MG 2.3 02/06/2022   Lab Results  Component Value Date   INR 1.02 05/22/2014   Lab Results  Component Value Date   CHOL  03/09/2008    169        ATP III CLASSIFICATION:  <200     mg/dL   Desirable  865-784  mg/dL   Borderline High  >=696    mg/dL   High   HDL 31 (L) 29/52/8413   LDLCALC (H) 03/09/2008    106        Total Cholesterol/HDL:CHD Risk Coronary Heart Disease Risk Table                     Men   Women  1/2 Average Risk   3.4   3.3   TRIG 159 (H) 03/09/2008   GEN- The patient is well appearing, alert and oriented x 3 today.   Neck - no JVD or carotid bruit noted Lungs- Clear to ausculation bilaterally, normal work of breathing Heart- Irregular rate and rhythm, no murmurs, rubs or gallops, PMI not laterally displaced Extremities- no clubbing, cyanosis, or edema Skin - no rash or ecchymosis  noted   EKG-   Vent. rate 80 BPM PR interval * ms QRS duration 68 ms QT/QTcB 368/424 ms P-R-T axes * 172 36 Atrial fibrillation Right axis deviation Anterior infarct , age undetermined Abnormal ECG When compared with ECG of 14-Nov-2022 09:14, PREVIOUS ECG IS PRESENT  Echo- 08/15/21 1. Left ventricular ejection fraction, by estimation, is 60  to 65%. Left  ventricular ejection fraction by 3D volume is 65 %. The left ventricle has  normal function. The left ventricle has no regional wall motion  abnormalities. There is mild asymmetric  left ventricular hypertrophy of the basal-septal segment. Left ventricular  diastolic function could not be evaluated.   2. Right ventricular systolic function is normal. The right ventricular  size is normal. There is normal pulmonary artery systolic pressure. The  estimated right ventricular systolic pressure is 25.7 mmHg.   3. Left atrial size was moderately dilated.   4. The mitral valve is grossly normal. Mild to moderate mitral valve  regurgitation.   5. The aortic valve is tricuspid. Aortic valve regurgitation is not  visualized. No aortic stenosis is present.   6. Aortic dilatation noted. There is borderline dilatation of the aortic  root, measuring 38 mm.   7. The inferior vena cava is normal in size with greater than 50%  respiratory variability, suggesting right atrial pressure of 3 mmHg.   Comparison(s): No prior Echocardiogram.    Assessment and Plan:  1. Persistent afib S/p ablation 07/11/21 and returned to afib 07/24/22 Successful cardioversion 08/14/22  He is currently in rate controlled Afib. Patient would like to pursue dofetilide and presents for dofetilide admission. Continue Eliquis, states no missed doses in the last 3 weeks. No recent benadryl use PharmD has screened medications QTc in SR 411 ms  Addendum for labs: creatinine 1.26, K 3.9, mag 2.1. CrCl estimate 74 mL/min qualifies for 500 mcg BID dosage.   2.  CHA2DS2VASc  score of 1 (age) Continue eliquis 5 mg bid - no missed doses.    Patient will present to admissions to check in has room 6E20.   L[place] Bells, PA-C Afib Clinic [facility] [address] [place], [place] [address] [phone]

## 2023-01-06 NOTE — H&P (Signed)
Primary Care Physician: Soundra Pilon, FNP Referring Physician: Dr. Doyle Askew III is a 67 y.o. male with a h/o afib with CVR that saw Dr. Elberta Fortis in September and was scheduled for an ablation pending 07/11/21. He reports he continues to be highly asymptomatic and well functioning with his afib. He usually will play pickle ball in the am and then go to the gym in the afternoons. He does notice a slow heart beat at times,when at rest, non sustained in the 30's- 40's that will increase back to  normal range with activity.   His health is unchanged since he saw Dr. Elberta Fortis last and no recent illness. Weight is stable, he is not prone to retain fluid.   F/u in afib clinic, 08/12/22. He is s/p ablation 07/11/22. He feels he went back into afib on 1/25. He has not felt bad with afib and it is rate controlled. He had been going to the gym and carrying out usual activities.  No missed anticoagulation and I discussed pursing a cardioversion which he is in agreement.   F/u in afib clinic, 08/21/22. He had a successful cardioversion and remains in SR today.   F/u in Afib clinic, 11/14/22. He is back in Afib since 5/7. He noted this via Kardiamobile device. Some shortness of breath but overall he is typically rate controlled when in Afib. He has never needed to take his prn diltiazem when in Afib. He is interested in staying in rhythm and hesitant to do another cardioversion without something else keeping him in normal rhythm. No missed doses of Eliquis 5 mg BID.   F/u in Afib clinic, 01/06/23. Patient presents today for Tikosyn admission. He is currently in rate controlled Afib. He has stopped Zestoretic as of this past Friday. No benadryl. No missed doses of Eliquis. No new medications since pharmacist review.   Today, he denies symptoms of palpitations, chest pain, shortness of breath, orthopnea, PND, lower extremity edema, dizziness, presyncope, syncope, or neurologic sequela. The patient is  tolerating medications without difficulties and is otherwise without complaint today.   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   Left Chest tx cx3 52fu   PONV (postoperative nausea and vomiting)    Past Surgical History:  Procedure Laterality Date   ACL implant     right knee    ATRIAL FIBRILLATION ABLATION N/A 07/11/2022   Procedure: ATRIAL FIBRILLATION ABLATION;  Surgeon: Regan Lemming, MD;  Location: [facility] INVASIVE CV LAB;  Service: Cardiovascular;  Laterality: N/A;   CARDIOVERSION N/A 08/21/2021   Procedure: CARDIOVERSION;  Surgeon: [name], MD;  Location: [facility] ENDOSCOPY;  Service: Cardiovascular;  Laterality: N/A;   CARDIOVERSION N/A 08/14/2022   Procedure: CARDIOVERSION;  Surgeon: Quintella Reichert, MD;  Location: [facility] ENDOSCOPY;  Service: Cardiovascular;  Laterality: N/A;   colonscopy      Excision of melanoma on chest wall  2013   TOTAL KNEE ARTHROPLASTY Right 05/29/2014   Procedure: RIGHT TOTAL KNEE ARTHROPLASTY WITH REMOVAL OF HARDWARE;  Surgeon: Shelda Pal, MD;  Location:  [facility] ORS;  Service: Orthopedics;  Laterality: Right;    Current Facility-Administered Medications  Medication Dose Route Frequency Provider Last Rate Last Admin   0.9 %  sodium chloride infusion  250 mL Intravenous PRN Eustace Pen, PA-C       acetaminophen (TYLENOL) tablet 500-1,000 mg  500-1,000 mg Oral Q6H PRN Eustace Pen, PA-C       apixaban Everlene Balls) tablet 5 mg  5 mg Oral BID Eustace Pen, PA-C       atorvastatin (LIPITOR) tablet 10 mg  10 mg Oral QPM Eustace Pen, PA-C       dofetilide (TIKOSYN) capsule 500 mcg  500 mcg Oral BID Eustace Pen, PA-C       [START ON 01/07/2023] lisinopril (ZESTRIL) tablet 20 mg  20 mg Oral Daily Eustace Pen, PA-C       sodium chloride flush (NS) 0.9 % injection 3 mL  3 mL Intravenous Q12H Eustace Pen, PA-C   3 mL at 01/06/23 1332   sodium chloride flush (NS) 0.9 % injection 3 mL  3 mL Intravenous PRN Eustace Pen, PA-C       [START ON 01/07/2023] tamsulosin (FLOMAX) capsule 0.4 mg  0.4 mg Oral Daily Eustace Pen, PA-C        No Known Allergies  ROS- All systems are reviewed and negative except as per the HPI above  Physical Exam: Vitals:   01/06/23 1308  Temp: 97.8 F (36.6 C)  TempSrc: Oral    Wt Readings from Last 3 Encounters:  01/06/23 92.4 kg  11/14/22 91.9 kg  10/14/22 92.5 kg    Labs: Lab Results  Component Value Date   NA 140 01/06/2023   K 3.9 01/06/2023   CL 111 01/06/2023   CO2 20 (L) 01/06/2023   GLUCOSE 101 (H) 01/06/2023   BUN 17 01/06/2023   CREATININE 1.26 (H) 01/06/2023   CALCIUM 9.3 01/06/2023   MG 2.1 01/06/2023   Lab Results  Component Value Date   INR 1.02 05/22/2014   Lab Results  Component Value Date   CHOL  03/09/2008    169        ATP III CLASSIFICATION:  <200     mg/dL   Desirable  161-096  mg/dL   Borderline High  >=045    mg/dL   High   HDL 31 (L) 40/98/1191   LDLCALC (H) 03/09/2008    106        Total Cholesterol/HDL:CHD Risk Coronary Heart Disease Risk Table                     Men   Women  1/2 Average Risk   3.4   3.3   TRIG 159 (H) 03/09/2008   GEN- The patient is well appearing, alert and oriented x 3 today.   Neck - no JVD or carotid bruit noted Lungs- Clear to ausculation bilaterally, normal work of breathing Heart- Irregular rate and rhythm, no murmurs, rubs or gallops, PMI not laterally displaced Extremities- no clubbing, cyanosis, or edema Skin - no rash or ecchymosis noted   EKG-   Vent. rate 80 BPM PR interval * ms QRS duration 68 ms QT/QTcB 368/424 ms P-R-T axes * 172 36 Atrial  fibrillation Right axis deviation Anterior infarct , age undetermined Abnormal ECG When compared with ECG of 14-Nov-2022 09:14, PREVIOUS ECG IS PRESENT  Echo- 08/15/21 1. Left ventricular ejection fraction, by estimation, is 60 to 65%.  Left  ventricular ejection fraction by 3D volume is 65 %. The left ventricle has  normal function. The left ventricle has no regional wall motion  abnormalities. There is mild asymmetric  left ventricular hypertrophy of the basal-septal segment. Left ventricular  diastolic function could not be evaluated.   2. Right ventricular systolic function is normal. The right ventricular  size is normal. There is normal pulmonary artery systolic pressure. The  estimated right ventricular systolic pressure is 25.7 mmHg.   3. Left atrial size was moderately dilated.   4. The mitral valve is grossly normal. Mild to moderate mitral valve  regurgitation.   5. The aortic valve is tricuspid. Aortic valve regurgitation is not  visualized. No aortic stenosis is present.   6. Aortic dilatation noted. There is borderline dilatation of the aortic  root, measuring 38 mm.   7. The inferior vena cava is normal in size with greater than 50%  respiratory variability, suggesting right atrial pressure of 3 mmHg.   Comparison(s): No prior Echocardiogram.    Assessment and Plan:  1. Persistent afib S/p ablation 07/11/21 and returned to afib 07/24/22 Successful cardioversion 08/14/22  He is currently in rate controlled Afib. Patient would like to pursue dofetilide and presents for dofetilide admission. Continue Eliquis, states no missed doses in the last 3 weeks. No recent benadryl use PharmD has screened medications QTc in SR 411 ms  Labs pending will update with addendum. Previous calculation had estimate of CrCl 88 qualifying for 500 mcg BID dosage.    2. CHA2DS2VASc  score of 1 (age) Continue eliquis 5 mg bid - no missed doses.    Pt presents today for tikosyn load as  above.   Casimiro Needle [address] [place], PA-C  01/06/2023 2:14 PM

## 2023-01-06 NOTE — Addendum Note (Signed)
Encounter addended by: Eustace Pen, PA-C on: 01/06/2023 2:50 PM  Actions taken: Clinical Note Signed

## 2023-01-06 NOTE — TOC CM/SW Note (Signed)
Transition of Care [facility]) - Inpatient Brief Assessment   Patient Details  Name: RONAN COCHELL MRN: [id number] Date of Birth: 01/11/1956  Transition of Care [facility]) CM/SW Contact:    Gala Lewandowsky, RN Phone Number: 01/06/2023, 3:29 PM   Clinical Narrative: Transition of Care Department [facility]) has reviewed the patient. Patient presented for Tikosyn Load. Benefits check submitted for cost. Case Manager will discuss cost and pharmacy of choice as the patient progresses.   Transition of Care Asessment: Insurance and Status: Insurance coverage has been reviewed Patient has primary care physician: Yes Prior/Current Home Services: No current home services Social Determinants of Health Reivew: SDOH reviewed no interventions necessary Readmission risk has been reviewed: Yes Transition of care needs: transition of care needs identified, TOC will continue to follow

## 2023-01-07 ENCOUNTER — Other Ambulatory Visit: Payer: Self-pay

## 2023-01-07 DIAGNOSIS — I4819 Other persistent atrial fibrillation: Secondary | ICD-10-CM | POA: Diagnosis not present

## 2023-01-07 LAB — BASIC METABOLIC PANEL
Anion gap: 5 (ref 5–15)
BUN: 21 mg/dL (ref 8–23)
CO2: 24 mmol/L (ref 22–32)
Calcium: 8.8 mg/dL — ABNORMAL LOW (ref 8.9–10.3)
Chloride: 108 mmol/L (ref 98–111)
Creatinine, Ser: 1.28 mg/dL — ABNORMAL HIGH (ref 0.61–1.24)
GFR, Estimated: >60 mL/min (ref >60)
Glucose, Bld: 85 mg/dL (ref 70–99)
Potassium: 4.3 mmol/L (ref 3.5–5.1)
Sodium: 137 mmol/L (ref 135–145)

## 2023-01-07 LAB — MAGNESIUM: Magnesium: 2 mg/dL (ref 1.7–2.4)

## 2023-01-07 LAB — HIV ANTIBODY (ROUTINE TESTING W REFLEX): HIV Screen 4th Generation wRfx: NONREACTIVE

## 2023-01-07 MED ORDER — SODIUM CHLORIDE 0.9 % IV SOLN: INTRAVENOUS | Status: DC

## 2023-01-07 NOTE — Progress Notes (Signed)
Morning EKG reviewed     Shows pt remains in afib with stable QTc at ~470-480 ms.  Continue  Tikosyn 500 mcg BID.   Potassium4.3 (07/10 0039) Magnesium  2.0 (07/10 0039) Creatinine, ser  1.28* (07/10 0039)  Pt will be NPO after midnight for DCCV if remains in afib   Graciella Freer, PA-C  01/07/2023 1:54 PM

## 2023-01-07 NOTE — Progress Notes (Signed)
   Electrophysiology Rounding Note  Patient Name: Lawrence Compton Date of Encounter: 01/07/2023  Primary Cardiologist: Maisie Fus, MD  Electrophysiologist: Regan Lemming, MD    Subjective   Pt remains in afib on Tikosyn 500 mcg BID   QTc from EKG last pm shows stable QTc at ~440 when corrected for bradycardia  The patient is doing well today.  At this time, the patient denies chest pain, shortness of breath, or any new concerns.  Inpatient Medications    Scheduled Meds:  apixaban  5 mg Oral BID   atorvastatin  10 mg Oral QPM   dofetilide  500 mcg Oral BID   lisinopril  20 mg Oral Daily   sodium chloride flush  3 mL Intravenous Q12H   tamsulosin  0.4 mg Oral Daily   Continuous Infusions:  sodium chloride     PRN Meds: sodium chloride, acetaminophen, sodium chloride flush   Vital Signs    Vitals:   01/06/23 1521 01/06/23 1930 01/06/23 2335 01/07/23 0335  BP: (!) 127/97 (!) 129/92 (!) 141/95 (!) 138/90  Pulse: (!) 56 (!) 49 99 (!) 46  Resp:  18 16 18   Temp: 97.6 F (36.4 C) 97.7 F (36.5 C) 97.8 F (36.6 C) 97.9 F (36.6 C)  TempSrc: Oral Oral Oral Oral  SpO2: 97% 97% 98% 98%    Intake/Output Summary (Last 24 hours) at 01/07/2023 5784 Last data filed at 01/06/2023 2000 Gross per 24 hour  Intake 490 ml  Output --  Net 490 ml   There were no vitals filed for this visit.  Physical Exam    GEN- NAD, A&O x 3. Normal affect.  Lungs- CTAB, Normal effort.  Heart-  Slow, irregularly irregular  rate and rhythm. No M/G/R GI- Soft, NT, ND Extremities- No clubbing, cyanosis, or edema Skin- no rash or lesion  Labs    CBC No results for input(s): "WBC", "NEUTROABS", "HGB", "HCT", "MCV", "PLT" in the last 72 hours. Basic Metabolic Panel Recent Labs    69/62/95 1130 01/07/23 0039  NA 140 137  K 3.9 4.3  CL 111 108  CO2 20* 24  GLUCOSE 101* 85  BUN 17 21  CREATININE 1.26* 1.28*  CALCIUM 9.3 8.8*  MG 2.1 2.0    Telemetry    AF with slow VR  40-60s (personally reviewed)  Patient Profile     Lawrence Compton is a 67 y.o. male with a past medical history significant for persistent atrial fibrillation.  They were admitted for tikosyn load.   Assessment & Plan    Persistent atrial fibrillation Pt remains in afib on Tikosyn 500 mcg BID  Continue Eliquis Creatinine, ser  1.28* (07/10 0039) Magnesium  2.0 (07/10 0039) Potassium4.3 (07/10 0039) No electrolyte supplementation needed  If pt does not convert chemically, plan on DCCV tomorrow   AF with slow VR Asymptomatic Follow with sinus Not on AV nodal agents  For questions or updates, please contact [facility] Please consult [email or web address] for contact info under Cardiology/STEMI.  Signed, Graciella Freer, PA-C  01/07/2023, 7:27 AM

## 2023-01-07 NOTE — Plan of Care (Signed)

## 2023-01-08 ENCOUNTER — Inpatient Hospital Stay (HOSPITAL_COMMUNITY): Payer: Medicare Other | Admitting: Certified Registered Nurse Anesthetist

## 2023-01-08 ENCOUNTER — Encounter (HOSPITAL_COMMUNITY)
Admission: AD | Disposition: A | Discharge status: Home / Self Care | Payer: Self-pay | Source: Ambulatory Visit | Attending: Internal Medicine

## 2023-01-08 ENCOUNTER — Encounter (HOSPITAL_COMMUNITY): Payer: Self-pay | Admitting: Cardiology

## 2023-01-08 ENCOUNTER — Inpatient Hospital Stay (HOSPITAL_COMMUNITY): Admit: 2023-01-08 | Payer: Medicare Other | Admitting: Cardiology

## 2023-01-08 ENCOUNTER — Encounter (HOSPITAL_COMMUNITY): Payer: Self-pay

## 2023-01-08 DIAGNOSIS — I10 Essential (primary) hypertension: Secondary | ICD-10-CM

## 2023-01-08 DIAGNOSIS — I4891 Unspecified atrial fibrillation: Secondary | ICD-10-CM

## 2023-01-08 DIAGNOSIS — I4892 Unspecified atrial flutter: Secondary | ICD-10-CM

## 2023-01-08 DIAGNOSIS — M1990 Unspecified osteoarthritis, unspecified site: Secondary | ICD-10-CM | POA: Diagnosis not present

## 2023-01-08 HISTORY — PX: CARDIOVERSION: SHX1299

## 2023-01-08 LAB — BASIC METABOLIC PANEL
Anion gap: 12 (ref 5–15)
BUN: 24 mg/dL — ABNORMAL HIGH (ref 8–23)
CO2: 22 mmol/L (ref 22–32)
Calcium: 9.1 mg/dL (ref 8.9–10.3)
Chloride: 106 mmol/L (ref 98–111)
Creatinine, Ser: 1.33 mg/dL — ABNORMAL HIGH (ref 0.61–1.24)
GFR, Estimated: 59 mL/min — ABNORMAL LOW (ref >60)
Glucose, Bld: 82 mg/dL (ref 70–99)
Potassium: 3.8 mmol/L (ref 3.5–5.1)
Sodium: 140 mmol/L (ref 135–145)

## 2023-01-08 LAB — MAGNESIUM: Magnesium: 2.1 mg/dL (ref 1.7–2.4)

## 2023-01-08 SURGERY — CARDIOVERSION
Anesthesia: General

## 2023-01-08 MED ORDER — PROPOFOL 10 MG/ML IV BOLUS
INTRAVENOUS | Status: DC | PRN
  Administered 2023-01-08: 80 mg via INTRAVENOUS

## 2023-01-08 MED ORDER — POTASSIUM CHLORIDE CRYS ER 20 MEQ PO TBCR
40.0000 meq | EXTENDED_RELEASE_TABLET | Freq: Once | ORAL | Status: AC
  Administered 2023-01-08: 40 meq via ORAL
  Filled 2023-01-08: qty 2

## 2023-01-08 MED ORDER — LIDOCAINE 2% (20 MG/ML) 5 ML SYRINGE
INTRAMUSCULAR | Status: DC | PRN
  Administered 2023-01-08: 80 mg via INTRAVENOUS

## 2023-01-08 SURGICAL SUPPLY — 1 items: ELECT DEFIB PAD ADLT CADENCE (PAD) ×2 IMPLANT

## 2023-01-08 NOTE — Progress Notes (Signed)
   Electrophysiology Rounding Note  Patient Name: Lawrence Compton Date of Encounter: 01/08/2023  Primary Cardiologist: Maisie Fus, MD  Electrophysiologist: Regan Lemming, MD    Subjective   Pt remains in afib on Tikosyn 500 mcg BID   QTc from EKG last pm shows stable QTc at ~450-460  The patient is doing well today.  At this time, the patient denies chest pain, shortness of breath, or any new concerns.  Inpatient Medications    Scheduled Meds:  apixaban  5 mg Oral BID   atorvastatin  10 mg Oral QPM   dofetilide  500 mcg Oral BID   lisinopril  20 mg Oral Daily   sodium chloride flush  3 mL Intravenous Q12H   tamsulosin  0.4 mg Oral Daily   Continuous Infusions:  sodium chloride     sodium chloride     PRN Meds: sodium chloride, acetaminophen, sodium chloride flush   Vital Signs    Vitals:   01/07/23 0810 01/07/23 1443 01/07/23 2014 01/08/23 0534  BP: (!) 132/102 119/85 132/85 (!) 136/91  Pulse:  70 (!) 59 65  Resp: 18 18 20 16   Temp: 97.7 F (36.5 C) (!) 97.5 F (36.4 C) 97.9 F (36.6 C) 97.8 F (36.6 C)  TempSrc: Oral Oral Oral Oral  SpO2: 99% 97% 98% 97%    Intake/Output Summary (Last 24 hours) at 01/08/2023 8657 Last data filed at 01/07/2023 0815 Gross per 24 hour  Intake 10 ml  Output --  Net 10 ml   There were no vitals filed for this visit.  Physical Exam    GEN- NAD, A&O x 3. Normal affect.  Lungs- CTAB, Normal effort.  Heart- Irregularly irregular rate and rhythm. No M/G/R GI- Soft, NT, ND Extremities- No clubbing, cyanosis, or edema Skin- no rash or lesion  Labs    CBC No results for input(s): "WBC", "NEUTROABS", "HGB", "HCT", "MCV", "PLT" in the last 72 hours. Basic Metabolic Panel Recent Labs    84/69/62 0039 01/08/23 0122  NA 137 140  K 4.3 3.8  CL 108 106  CO2 24 22  GLUCOSE 85 82  BUN 21 24*  CREATININE 1.28* 1.33*  CALCIUM 8.8* 9.1  MG 2.0 2.1    Telemetry    AF with rates 50-70s (personally  reviewed)  Patient Profile     Lawrence Compton is a 67 y.o. male with a past medical history significant for persistent atrial fibrillation.  They were admitted for tikosyn load.   Assessment & Plan    Persistent atrial fibrillation Pt remains in afib on Tikosyn 500 mcg BID  Continue Eliquis Creatinine, ser  1.33* (07/11 0122) Magnesium  2.1 (07/11 0122) Potassium3.8 (07/11 0122) Supplement K  For cardioversion today.   Home tomorrow if QT remains stable.    For questions or updates, please contact [facility] Please consult [email or web address] for contact info under Cardiology/STEMI.  Signed, Graciella Freer, PA-C  01/08/2023, 7:18 AM

## 2023-01-08 NOTE — Progress Notes (Signed)
Morning EKG reviewed     Shows is in NSR s/p DCC with stable QTc at ~450-460 ms.  Continue  Tikosyn 500 mcg BID.   Potassium3.8 (07/11 0122) Magnesium  2.1 (07/11 0122) Creatinine, ser  1.33* (07/11 0122)  Plan for home Friday if QTc remains stable   Graciella Freer, [place]  01/08/2023 2:29 PM

## 2023-01-08 NOTE — Plan of Care (Signed)
  Problem: Education: Goal: Knowledge of General Education information will improve Description Including pain rating scale, medication(s)/side effects and non-pharmacologic comfort measures Outcome: Progressing   

## 2023-01-08 NOTE — Anesthesia Postprocedure Evaluation (Signed)
Anesthesia Post Note  Patient: Lawrence Compton  Procedure(s) Performed: CARDIOVERSION     Patient location during evaluation: PACU Anesthesia Type: General Level of consciousness: awake Pain management: pain level controlled Vital Signs Assessment: post-procedure vital signs reviewed and stable Respiratory status: spontaneous breathing, nonlabored ventilation and respiratory function stable Cardiovascular status: blood pressure returned to baseline and stable Postop Assessment: no apparent nausea or vomiting Anesthetic complications: no   No notable events documented.  Last Vitals:  Vitals:   01/08/23 1125 01/08/23 1130  BP: (!) 128/102 (!) 125/95  Pulse: (!) 57 (!) 57  Resp: (!) 8 14  Temp:    SpO2: 97% 96%    Last Pain:  Vitals:   01/08/23 1130  TempSrc:   PainSc: 0-No pain                 [name]

## 2023-01-08 NOTE — Addendum Note (Signed)
Addendum  created 01/08/23 1142 by [name], MD   Clinical Note Signed

## 2023-01-08 NOTE — Transfer of Care (Signed)
Immediate Anesthesia Transfer of Care Note  Patient: Lawrence Compton  Procedure(s) Performed: CARDIOVERSION  Patient Location: Cath Lab  Anesthesia Type:General  Level of Consciousness: drowsy and patient cooperative  Airway & Oxygen Therapy: Patient Spontanous Breathing and Patient connected to nasal cannula oxygen  Post-op Assessment: Report given to RN, Post -op Vital signs reviewed and stable, and Patient moving all extremities X 4  Post vital signs: Reviewed and stable  Last Vitals:  Vitals Value Taken Time  BP 122/79   Temp    Pulse 67   Resp 12   SpO2 95     Last Pain:  Vitals:   01/08/23 1031  TempSrc: Temporal  PainSc:          Complications: No notable events documented.

## 2023-01-08 NOTE — CV Procedure (Signed)
    Electrical Cardioversion Procedure Note Lawrence Compton [id number] [birth year]  Procedure: Electrical Cardioversion Indications:Atrial Flutter  Time Out: Verified patient identification, verified procedure,medications/allergies/relevent history reviewed, required imaging and test results available.  Performed  Procedure Details  The patient was NPO after midnight. Anesthesia was administered at the beside  by Dr.Allen with 80mg  of propofol and 80mg  of Lidocaine. Cardioversion was done with synchronized biphasic defibrillation with AP pads with 150watts.  The patient converted to normal sinus rhythm. The patient tolerated the procedure well   IMPRESSION:  Successful cardioversion of atrial flutter    Lawrence Compton 01/08/2023, 10:48 AM

## 2023-01-08 NOTE — Interval H&P Note (Signed)
History and Physical Interval Note:  01/08/2023 10:48 AM  Lawrence Compton  has presented today for surgery, with the diagnosis of afib.  The various methods of treatment have been discussed with the patient and family. After consideration of risks, benefits and other options for treatment, the patient has consented to  Procedure(s): CARDIOVERSION (N/A) as a surgical intervention.  The patient's history has been reviewed, patient examined, no change in status, stable for surgery.  I have reviewed the patient's chart and labs.  Questions were answered to the patient's satisfaction.     Armanda Magic

## 2023-01-08 NOTE — Progress Notes (Signed)
Pharmacy: Dofetilide (Tikosyn) - Follow Up Assessment and Electrolyte Replacement  Pharmacy consulted to assist in monitoring and replacing electrolytes in this 67 y.o. male admitted on 01/06/2023 undergoing dofetilide initiation. First dofetilide dose: 01/06/23 pm.  Labs:    Component Value Date/Time   K 3.8 01/08/2023 0122   MG 2.1 01/08/2023 0122     Plan: Potassium: K 3.8-3.9:  Give KCl 40 mEq po x1   Magnesium: Mg > 2: No additional supplementation needed   Thank you for allowing pharmacy to participate in this patient's care   Lawrence Compton 01/08/2023  7:39 AM

## 2023-01-08 NOTE — Anesthesia Preprocedure Evaluation (Addendum)
Anesthesia Evaluation  Patient identified by MRN, date of birth, ID band Patient awake    Reviewed: Allergy & Precautions, NPO status , Patient's Chart, lab work & pertinent test results  History of Anesthesia Complications (+) PONV and history of anesthetic complications  Airway Mallampati: I  TM Distance: >3 FB Neck ROM: Full   Comment: Previous grade I view with MAC 4, easy mask Dental  (+) Dental Advisory Given, Caps   Pulmonary neg pulmonary ROS   Pulmonary exam normal breath sounds clear to auscultation       Cardiovascular hypertension (diltiazem, lisinopril), Pt. on medications (-) angina (-) Past MI, (-) Cardiac Stents and (-) CABG + dysrhythmias Atrial Fibrillation + Valvular Problems/Murmurs (mild-to-moderate) MR  Rhythm:Irregular Rate:Normal  HLD  TTE 08/15/2021: IMPRESSIONS     1. Left ventricular ejection fraction, by estimation, is 60 to 65%. Left  ventricular ejection fraction by 3D volume is 65 %. The left ventricle has  normal function. The left ventricle has no regional wall motion  abnormalities. There is mild asymmetric  left ventricular hypertrophy of the basal-septal segment. Left ventricular  diastolic function could not be evaluated.   2. Right ventricular systolic function is normal. The right ventricular  size is normal. There is normal pulmonary artery systolic pressure. The  estimated right ventricular systolic pressure is 25.7 mmHg.   3. Left atrial size was moderately dilated.   4. The mitral valve is grossly normal. Mild to moderate mitral valve  regurgitation.   5. The aortic valve is tricuspid. Aortic valve regurgitation is not  visualized. No aortic stenosis is present.   6. Aortic dilatation noted. There is borderline dilatation of the aortic  root, measuring 38 mm.   7. The inferior vena cava is normal in size with greater than 50%  respiratory variability, suggesting right atrial  pressure of 3 mmHg.     Neuro/Psych negative neurological ROS     GI/Hepatic negative GI ROS, Neg liver ROS,,,  Endo/Other  negative endocrine ROS    Renal/GU negative Renal ROS     Musculoskeletal  (+) Arthritis ,    Abdominal   Peds  Hematology negative hematology ROS (+)   Anesthesia Other Findings Last Eliquis: this morning  Reproductive/Obstetrics                             Anesthesia Physical Anesthesia Plan  ASA: 3  Anesthesia Plan: General   Post-op Pain Management:    Induction: Intravenous  PONV Risk Score and Plan: 3 and Treatment may vary due to age or medical condition  Airway Management Planned: Mask and Natural Airway  Additional Equipment:   Intra-op Plan:   Post-operative Plan:   Informed Consent: I have reviewed the patients History and Physical, chart, labs and discussed the procedure including the risks, benefits and alternatives for the proposed anesthesia with the patient or authorized representative who has indicated his/her understanding and acceptance.     Dental advisory given  Plan Discussed with: Anesthesiologist and CRNA  Anesthesia Plan Comments: (Risks of general anesthesia discussed including, but not limited to, sore throat, hoarse voice, chipped/damaged teeth, injury to vocal cords, nausea and vomiting, allergic reactions, lung infection, heart attack, stroke, and death. All questions answered. )        Anesthesia Quick Evaluation

## 2023-01-09 ENCOUNTER — Other Ambulatory Visit (HOSPITAL_COMMUNITY): Payer: Self-pay

## 2023-01-09 ENCOUNTER — Encounter (HOSPITAL_COMMUNITY): Payer: Self-pay | Admitting: Cardiology

## 2023-01-09 DIAGNOSIS — I4819 Other persistent atrial fibrillation: Secondary | ICD-10-CM | POA: Diagnosis not present

## 2023-01-09 LAB — BASIC METABOLIC PANEL
Anion gap: 13 (ref 5–15)
BUN: 20 mg/dL (ref 8–23)
CO2: 25 mmol/L (ref 22–32)
Calcium: 9.2 mg/dL (ref 8.9–10.3)
Chloride: 103 mmol/L (ref 98–111)
Creatinine, Ser: 1.22 mg/dL (ref 0.61–1.24)
GFR, Estimated: >60 mL/min (ref >60)
Glucose, Bld: 97 mg/dL (ref 70–99)
Potassium: 4.1 mmol/L (ref 3.5–5.1)
Sodium: 141 mmol/L (ref 135–145)

## 2023-01-09 LAB — MAGNESIUM: Magnesium: 2.1 mg/dL (ref 1.7–2.4)

## 2023-01-09 MED ORDER — DOFETILIDE 500 MCG PO CAPS
500.0000 ug | ORAL_CAPSULE | Freq: Two times a day (BID) | ORAL | 6 refills | Status: DC
Start: 2023-01-09 — End: 2023-01-16
  Filled 2023-01-09: qty 60, 30d supply, fill #0

## 2023-01-09 MED ORDER — ORAL CARE MOUTH RINSE: 15.0000 mL | OROMUCOSAL | Status: DC | PRN

## 2023-01-09 NOTE — Progress Notes (Signed)
EKG from yesterday evening 01/08/2023 reviewed     Shows remains in NSR with stable QTc at ~440-450 ms.  Continue  Tikosyn 500 mcg BID.   Potassium4.1 (07/12 0257) Magnesium  2.1 (07/12 0257) Creatinine, ser  1.22 (07/12 0257)  Plan for home this afternoon if QTc remains stable.   Graciella Freer, PA-C  01/09/2023 7:17 AM

## 2023-01-09 NOTE — Plan of Care (Signed)
  Problem: Education: Goal: Knowledge of General Education information will improve Description: Including pain rating scale, medication(s)/side effects and non-pharmacologic comfort measures Outcome: Progressing   Problem: Clinical Measurements: Goal: Ability to maintain clinical measurements within normal limits will improve Outcome: Progressing Goal: Will remain free from infection Outcome: Progressing Goal: Diagnostic test results will improve Outcome: Progressing Goal: Cardiovascular complication will be avoided Outcome: Progressing   Problem: Nutrition: Goal: Adequate nutrition will be maintained Outcome: Progressing   Problem: Elimination: Goal: Will not experience complications related to bowel motility Outcome: Progressing   Problem: Pain Managment: Goal: General experience of comfort will improve Outcome: Progressing   Problem: Safety: Goal: Ability to remain free from injury will improve Outcome: Progressing   Problem: Skin Integrity: Goal: Risk for impaired skin integrity will decrease Outcome: Progressing   

## 2023-01-09 NOTE — Discharge Summary (Signed)
ELECTROPHYSIOLOGY DISCHARGE SUMMARY    Patient ID: Lawrence Compton,  MRN: [id number], DOB/AGE: 08/17/1955 67 y.o.  Admit date: 01/06/2023 Discharge date: 01/09/2023  Primary Care Physician: Soundra Pilon, FNP  Primary Cardiologist: Maisie Fus, MD  Electrophysiologist: Dr. Elberta Fortis   Primary Discharge Diagnosis:  1.  Persistent atrial fibrillation status post Tikosyn loading this admission   No Known Allergies   Procedures This Admission:  1.  Tikosyn loading  2.  Direct current cardioversion on Thursday January 08, 2023 by Dr Mayford Knife which successfully restored SR.  There were no early apparent complications.   Brief HPI: Lawrence Compton is a 66 y.o. male with a past medical history as noted above.  They were referred to EP for treatment options of atrial fibrillation.  Risks, benefits, and alternatives to Tikosyn were reviewed with the patient who wished to proceed with admission for loading.  Hospital Course:  The patient was admitted and Tikosyn was initiated.  Renal function and electrolytes were followed during the hospitalization.  Their QTc remained stable. On 7/11 they underwent direct current cardioversion which restored sinus rhythm. The patients QTc remained stable. They were monitored on telemetry up to discharge. On the day of discharge, they were examined by Dr. Lalla Brothers  who considered them stable for discharge to home.  Follow-up has been arranged with the Atrial Fibrillation clinic in approximately 1 week.   Physical Exam: Vitals:   01/08/23 1147 01/08/23 1424 01/08/23 2003 01/09/23 0523  BP: (!) 137/99 120/80 (!) 132/94 124/77  Pulse: 60  (!) 59 60  Resp: 16 18 20 18   Temp: 97.7 F (36.5 C) (!) 97.5 F (36.4 C) 97.7 F (36.5 C) 97.9 F (36.6 C)  TempSrc: Oral Oral Oral Oral  SpO2: 100%  98% 96%    GEN- NAD, A&O x 3. Normal affect.  Lungs- CTAB, Normal effort.  Heart- Regular rate and rhythm. No M/G/R GI- Soft, NT, ND Extremities- No  clubbing, cyanosis, or edema Skin- no rash or lesion  Labs:   Lab Results  Component Value Date   WBC 6.3 08/12/2022   HGB 16.5 08/12/2022   HCT 46.6 08/12/2022   MCV 85.8 08/12/2022   PLT 181 08/12/2022    Recent Labs  Lab 01/09/23 0257  NA 141  K 4.1  CL 103  CO2 25  BUN 20  CREATININE 1.22  CALCIUM 9.2  GLUCOSE 97    Discharge Medications:  Allergies as of 01/09/2023   No Known Allergies      Medication List     TAKE these medications    acetaminophen 500 MG tablet Commonly known as: TYLENOL Take 500-1,000 mg by mouth every 6 (six) hours as needed (pain).   atorvastatin 10 MG tablet Commonly known as: LIPITOR Take 10 mg by mouth every evening. Pt takes at 7 pm   diclofenac Sodium 1 % Gel Commonly known as: VOLTAREN Apply 1 Application topically 2 (two) times daily as needed (knee pain).   diltiazem 30 MG tablet Commonly known as: Cardizem Take 30 mg every 6 hours as needed for heart rate greater than 120   dofetilide 500 MCG capsule Commonly known as: TIKOSYN Take 1 capsule (500 mcg total) by mouth 2 (two) times daily.   Eliquis 5 MG Tabs tablet Generic drug: apixaban TAKE ONE TABLET BY MOUTH TWICE A DAY What changed: additional instructions   lisinopril 20 MG tablet Commonly known as: ZESTRIL Take 1 tablet (20 mg total) by mouth  daily. What changed: additional instructions   tamsulosin 0.4 MG Caps capsule Commonly known as: Flomax Take 1 capsule (0.4 mg total) by mouth daily. What changed: additional instructions        Disposition:    Follow-up Information     [facility] Follow up.   Specialty: Cardiology Why: on 7/19 at 2 pm for post tikosyn follow up. Contact information: [address] [id number] [place] [address] [phone]                Duration of Discharge Encounter: Greater than 30 minutes including physician  time.  Dustin Flock, PA-C  01/09/2023 11:05 AM

## 2023-01-09 NOTE — Care Management (Signed)
1610 01-09-23 Patient presented for Tikosyn Load. Case Manager spoke with the patient regarding co pay cost. Patient is agreeable for the initial Rx filled via [facility] and the Rx refills 90 day supply escribed to [facility]. Patient will utilize [facility] for assistance and have [employer] send the Rx's to pharmacy of choice. No further needs identified at this time.

## 2023-01-09 NOTE — Plan of Care (Signed)

## 2023-01-09 NOTE — Plan of Care (Signed)
  Problem: Education: Goal: Knowledge of General Education information will improve Description: Including pain rating scale, medication(s)/side effects and non-pharmacologic comfort measures 01/09/2023 1126 by Georgiann Mccoy, RN Outcome: Adequate for Discharge 01/09/2023 0914 by Georgiann Mccoy, RN Outcome: Progressing   Problem: Health Behavior/Discharge Planning: Goal: Ability to manage health-related needs will improve 01/09/2023 1126 by Georgiann Mccoy, RN Outcome: Adequate for Discharge 01/09/2023 0914 by Georgiann Mccoy, RN Outcome: Progressing   Problem: Clinical Measurements: Goal: Ability to maintain clinical measurements within normal limits will improve 01/09/2023 1126 by Georgiann Mccoy, RN Outcome: Adequate for Discharge 01/09/2023 0914 by Georgiann Mccoy, RN Outcome: Progressing Goal: Will remain free from infection 01/09/2023 1126 by Georgiann Mccoy, RN Outcome: Adequate for Discharge 01/09/2023 0914 by Georgiann Mccoy, RN Outcome: Progressing Goal: Diagnostic test results will improve 01/09/2023 1126 by Georgiann Mccoy, RN Outcome: Adequate for Discharge 01/09/2023 0914 by Georgiann Mccoy, RN Outcome: Progressing Goal: Respiratory complications will improve 01/09/2023 1126 by Georgiann Mccoy, RN Outcome: Adequate for Discharge 01/09/2023 0914 by Georgiann Mccoy, RN Outcome: Progressing Goal: Cardiovascular complication will be avoided 01/09/2023 1126 by Georgiann Mccoy, RN Outcome: Adequate for Discharge 01/09/2023 0914 by Georgiann Mccoy, RN Outcome: Progressing   Problem: Activity: Goal: Risk for activity intolerance will decrease 01/09/2023 1126 by Georgiann Mccoy, RN Outcome: Adequate for Discharge 01/09/2023 0914 by Georgiann Mccoy, RN Outcome: Progressing   Problem: Nutrition: Goal: Adequate nutrition will be maintained 01/09/2023 1126 by Georgiann Mccoy, RN Outcome: Adequate for Discharge 01/09/2023 0914 by Georgiann Mccoy, RN Outcome:  Progressing   Problem: Coping: Goal: Level of anxiety will decrease 01/09/2023 1126 by Georgiann Mccoy, RN Outcome: Adequate for Discharge 01/09/2023 0914 by Georgiann Mccoy, RN Outcome: Progressing   Problem: Elimination: Goal: Will not experience complications related to bowel motility 01/09/2023 1126 by Georgiann Mccoy, RN Outcome: Adequate for Discharge 01/09/2023 0914 by Georgiann Mccoy, RN Outcome: Progressing Goal: Will not experience complications related to urinary retention 01/09/2023 1126 by Georgiann Mccoy, RN Outcome: Adequate for Discharge 01/09/2023 0914 by Georgiann Mccoy, RN Outcome: Progressing   Problem: Pain Managment: Goal: General experience of comfort will improve 01/09/2023 1126 by Georgiann Mccoy, RN Outcome: Adequate for Discharge 01/09/2023 0914 by Georgiann Mccoy, RN Outcome: Progressing   Problem: Safety: Goal: Ability to remain free from injury will improve 01/09/2023 1126 by Georgiann Mccoy, RN Outcome: Adequate for Discharge 01/09/2023 0914 by Georgiann Mccoy, RN Outcome: Progressing   Problem: Skin Integrity: Goal: Risk for impaired skin integrity will decrease 01/09/2023 1126 by Georgiann Mccoy, RN Outcome: Adequate for Discharge 01/09/2023 0914 by Georgiann Mccoy, RN Outcome: Progressing

## 2023-01-09 NOTE — Progress Notes (Addendum)
Pharmacy: Dofetilide (Tikosyn) - Follow Up Assessment and Electrolyte Replacement  Pharmacy consulted to assist in monitoring and replacing electrolytes in this 67 y.o. male admitted on 01/06/2023 undergoing dofetilide initiation. First dofetilide dose: 01/06/23 pm.  Labs:    Component Value Date/Time   K 4.1 01/09/2023 0257   MG 2.1 01/09/2023 0257     Plan: Potassium: K >/= 4: No additional supplementation needed  Magnesium: Mg > 2: No additional supplementation needed   As patient has required on average 20 mEq of potassium replacement every day, recommend discharging patient with prescription for:  Potassium chloride 20 mEq  daily  Thank you for allowing pharmacy to participate in this patient's care   Roxas Clymer 01/09/2023  7:07 AM

## 2023-01-09 NOTE — Progress Notes (Deleted)
Pharmacy: Dofetilide (Tikosyn) - Follow Up Assessment and Electrolyte Replacement  Pharmacy consulted to assist in monitoring and replacing electrolytes in this 67 y.o. male admitted on 01/06/2023 undergoing dofetilide initiation. First dofetilide dose: 01/06/23 pm.  Labs:    Component Value Date/Time   K 4.1 01/09/2023 0257   MG 2.1 01/09/2023 0257     Plan: Potassium: K >/= 4: No additional supplementation needed  Magnesium: Mg > 2: No additional supplementation needed   Thank you for allowing pharmacy to participate in this patient's care   Brendaliz Kuk 01/09/2023  7:02 AM

## 2023-01-09 NOTE — Care Management Important Message (Signed)
Important Message  Patient Details  Name: Lawrence Compton MRN: [id number] Date of Birth: Dec 07, 1955   Medicare Important Message Given:  Yes     Renie Ora 01/09/2023, 9:30 AM

## 2023-01-16 ENCOUNTER — Ambulatory Visit (HOSPITAL_COMMUNITY)
Admit: 2023-01-16 | Discharge: 2023-01-16 | Disposition: A | Discharge status: Home / Self Care | Payer: Medicare Other | Source: Ambulatory Visit | Attending: Internal Medicine | Admitting: Internal Medicine

## 2023-01-16 ENCOUNTER — Encounter (HOSPITAL_COMMUNITY): Payer: Self-pay | Admitting: Internal Medicine

## 2023-01-16 VITALS — BP 118/84 | HR 63 | Ht 68.0 in | Wt 201.8 lb

## 2023-01-16 DIAGNOSIS — I4891 Unspecified atrial fibrillation: Secondary | ICD-10-CM | POA: Diagnosis not present

## 2023-01-16 DIAGNOSIS — Z48812 Encounter for surgical aftercare following surgery on the circulatory system: Secondary | ICD-10-CM | POA: Insufficient documentation

## 2023-01-16 DIAGNOSIS — I4819 Other persistent atrial fibrillation: Secondary | ICD-10-CM | POA: Insufficient documentation

## 2023-01-16 DIAGNOSIS — Z7901 Long term (current) use of anticoagulants: Secondary | ICD-10-CM | POA: Diagnosis not present

## 2023-01-16 DIAGNOSIS — Z5181 Encounter for therapeutic drug level monitoring: Secondary | ICD-10-CM | POA: Insufficient documentation

## 2023-01-16 DIAGNOSIS — Z79899 Other long term (current) drug therapy: Secondary | ICD-10-CM | POA: Insufficient documentation

## 2023-01-16 LAB — BASIC METABOLIC PANEL
Anion gap: 7 (ref 5–15)
BUN: 21 mg/dL (ref 8–23)
CO2: 23 mmol/L (ref 22–32)
Calcium: 9.1 mg/dL (ref 8.9–10.3)
Chloride: 107 mmol/L (ref 98–111)
Creatinine, Ser: 1.16 mg/dL (ref 0.61–1.24)
GFR, Estimated: >60 mL/min (ref >60)
Glucose, Bld: 98 mg/dL (ref 70–99)
Potassium: 3.9 mmol/L (ref 3.5–5.1)
Sodium: 137 mmol/L (ref 135–145)

## 2023-01-16 LAB — MAGNESIUM: Magnesium: 2.2 mg/dL (ref 1.7–2.4)

## 2023-01-16 MED ORDER — DOFETILIDE 500 MCG PO CAPS: 500.0000 ug | ORAL_CAPSULE | Freq: Two times a day (BID) | ORAL | 1 refills | Status: DC

## 2023-01-16 NOTE — Progress Notes (Signed)
Primary Care Physician: Soundra Pilon, FNP Referring Physician: Dr. Doyle Askew III is a 67 y.o. male with a h/o afib with CVR that saw Dr. Elberta Fortis in September and was scheduled for an ablation pending 07/11/21. He reports he continues to be highly asymptomatic and well functioning with his afib. He usually will play pickle ball in the am and then go to the gym in the afternoons. He does notice a slow heart beat at times,when at rest, non sustained in the 30's- 40's that will increase back to  normal range with activity.   His health is unchanged since he saw Dr. Elberta Fortis last and no recent illness. Weight is stable, he is not prone to retain fluid.   F/u in afib clinic, 08/12/22. He is s/p ablation 07/11/22. He feels he went back into afib on 1/25. He has not felt bad with afib and it is rate controlled. He had been going to the gym and carrying out usual activities.  No missed anticoagulation and I discussed pursing a cardioversion which he is in agreement.   F/u in afib clinic, 08/21/22. He had a successful cardioversion and remains in SR today.   F/u in Afib clinic, 11/14/22. He is back in Afib since 5/7. He noted this via Kardiamobile device. Some shortness of breath but overall he is typically rate controlled when in Afib. He has never needed to take his prn diltiazem when in Afib. He is interested in staying in rhythm and hesitant to do another cardioversion without something else keeping him in normal rhythm. No missed doses of Eliquis 5 mg BID.   F/u in Afib clinic, 01/06/23. Patient presents today for Tikosyn admission. He is currently in rate controlled Afib. He has stopped Zestoretic as of this past Friday. No benadryl. No missed doses of Eliquis. No new medications since pharmacist review.   F/u in Afib clinic, 01/16/23. Patient is s/p Tikosyn admission 7/9-12/24. He underwent successful cardioversion on 01/08/23. Discharged on Tikosyn 500 mcg BID. He feels well overall.  Intermittent afternoon headaches but not everyday. No missed doses of Eliquis.   Today, he denies symptoms of palpitations, chest pain, shortness of breath, orthopnea, PND, lower extremity edema, dizziness, presyncope, syncope, or neurologic sequela. The patient is tolerating medications without difficulties and is otherwise without complaint today.   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   Left Chest tx cx3 51fu   PONV (postoperative nausea and vomiting)    Past Surgical History:  Procedure Laterality Date   ACL implant     right knee    ATRIAL FIBRILLATION ABLATION N/A 07/11/2022   Procedure: ATRIAL FIBRILLATION ABLATION;  Surgeon: Regan Lemming, MD;  Location: [facility];  Service: Cardiovascular;  Laterality: N/A;   CARDIOVERSION N/A 08/21/2021   Procedure: CARDIOVERSION;  Surgeon: [name], MD;  Location: [facility] ENDOSCOPY;  Service: Cardiovascular;  Laterality: N/A;   CARDIOVERSION N/A 08/14/2022   Procedure: CARDIOVERSION;  Surgeon: Quintella Reichert, MD;  Location: [facility] ENDOSCOPY;  Service: Cardiovascular;  Laterality: N/A;  CARDIOVERSION N/A 01/08/2023   Procedure: CARDIOVERSION;  Surgeon: Quintella Reichert, MD;  Location: [facility] IN[facility] CV LAB;  Service: Cardiovascular;  Laterality: N/A;   colonscopy      Excision of melanoma on chest wall  2013   TOTAL KNEE ARTHROPLASTY Right 05/29/2014   Procedure: RIGHT TOTAL KNEE ARTHROPLASTY WITH REMOVAL OF HARDWARE;  Surgeon: Shelda Pal, MD;  Location: [facility] ORS;  Service: Orthopedics;  Laterality: Right;    Current Outpatient Medications  Medication Sig Dispense Refill   acetaminophen  (TYLENOL) 500 MG tablet Take 500-1,000 mg by mouth every 6 (six) hours as needed (pain).     atorvastatin (LIPITOR) 10 MG tablet Take 10 mg by mouth every evening. Pt takes at 7 pm     diclofenac Sodium (VOLTAREN) 1 % GEL Apply 1 Application topically 2 (two) times daily as needed (knee pain).     diltiazem (CARDIZEM) 30 MG tablet Take 30 mg every 6 hours as needed for heart rate greater than 120 60 tablet 6   dofetilide (TIKOSYN) 500 MCG capsule Take 1 capsule (500 mcg total) by mouth 2 (two) times daily. 60 capsule 6   ELIQUIS 5 MG TABS tablet TAKE ONE TABLET BY MOUTH TWICE A DAY (Patient taking differently: Take 5 mg by mouth 2 (two) times daily. Pt takes at 7 am and 7 pm) 180 tablet 3   lisinopril (ZESTRIL) 20 MG tablet Take 1 tablet (20 mg total) by mouth daily. (Patient taking differently: Take 20 mg by mouth daily. Pt takes at 7 am) 30 tablet 3   tamsulosin (FLOMAX) 0.4 MG CAPS capsule Take 1 capsule (0.4 mg total) by mouth daily. (Patient taking differently: Take 0.4 mg by mouth daily. Pt takes at 7 am) 30 capsule 0   No current facility-administered medications for this visit.    No Known Allergies  ROS- All systems are reviewed and negative except as per the HPI above  Physical Exam: There were no vitals filed for this visit.   Wt Readings from Last 3 Encounters:  01/06/23 92.4 kg  11/14/22 91.9 kg  10/14/22 92.5 kg    Labs: Lab Results  Component Value Date   NA 141 01/09/2023   K 4.1 01/09/2023   CL 103 01/09/2023   CO2 25 01/09/2023   GLUCOSE 97 01/09/2023   BUN 20 01/09/2023   CREATININE 1.22 01/09/2023   CALCIUM 9.2 01/09/2023   MG 2.1 01/09/2023   Lab Results  Component Value Date   INR 1.02 05/22/2014   Lab Results  Component Value Date   CHOL  03/09/2008    169        ATP III CLASSIFICATION:  <200     mg/dL   Desirable  161-096  mg/dL   Borderline High  >=045    mg/dL   High   HDL 31 (L) 40/98/1191   LDLCALC (H) 03/09/2008    106        Total  Cholesterol/HDL:CHD Risk Coronary Heart Disease Risk Table                     Men   Women  1/2 Average Risk   3.4   3.3   TRIG 159 (H) 03/09/2008   GEN- The patient is well appearing, alert and oriented x 3 today.   Neck - no JVD or carotid bruit noted Lungs- Clear to ausculation bilaterally, normal work of breathing Heart- Regular rate and rhythm, no murmurs, rubs or gallops, PMI not  laterally displaced Extremities- no clubbing, cyanosis, or edema Skin - no rash or ecchymosis noted  EKG-   Vent. rate 63 BPM PR interval 264 ms QRS duration 68 ms QT/QTcB 430/440 ms P-R-T axes 30 207 44 Sinus rhythm with 1st degree A-V block with Premature atrial complexes Right superior axis deviation Septal infarct , age undetermined Abnormal ECG When compared with ECG of 09-Jan-2023 10:32, PREVIOUS ECG IS PRESENT  Echo- 08/15/21 1. Left ventricular ejection fraction, by estimation, is 60 to 65%. Left  ventricular ejection fraction by 3D volume is 65 %. The left ventricle has  normal function. The left ventricle has no regional wall motion  abnormalities. There is mild asymmetric  left ventricular hypertrophy of the basal-septal segment. Left ventricular  diastolic function could not be evaluated.   2. Right ventricular systolic function is normal. The right ventricular  size is normal. There is normal pulmonary artery systolic pressure. The  estimated right ventricular systolic pressure is 25.7 mmHg.   3. Left atrial size was moderately dilated.   4. The mitral valve is grossly normal. Mild to moderate mitral valve  regurgitation.   5. The aortic valve is tricuspid. Aortic valve regurgitation is not  visualized. No aortic stenosis is present.   6. Aortic dilatation noted. There is borderline dilatation of the aortic  root, measuring 38 mm.   7. The inferior vena cava is normal in size with greater than 50%  respiratory variability, suggesting right atrial pressure of 3 mmHg.    Comparison(s): No prior Echocardiogram.    Assessment and Plan:  1. Persistent afib S/p ablation 07/11/21 and returned to afib 07/24/22 Successful cardioversion 08/14/22 S/p Tikosyn admission 7/9-12/24.   He is currently in NSR. Qtc stable. Bmet and mag drawn today. Continue Tikosyn 500 mcg BID.   2. CHA2DS2VASc  score of 1 (age) Continue eliquis 5 mg bid - no missed doses.    F/u 1 month for Tikosyn surveillance.   Lake Bells, PA-C [facility] [address] [place], [place] [address] [phone]

## 2023-01-21 DIAGNOSIS — I10 Essential (primary) hypertension: Secondary | ICD-10-CM | POA: Diagnosis not present

## 2023-01-21 DIAGNOSIS — E785 Hyperlipidemia, unspecified: Secondary | ICD-10-CM | POA: Diagnosis not present

## 2023-01-21 DIAGNOSIS — D6869 Other thrombophilia: Secondary | ICD-10-CM | POA: Diagnosis not present

## 2023-01-21 DIAGNOSIS — Z09 Encounter for follow-up examination after completed treatment for conditions other than malignant neoplasm: Secondary | ICD-10-CM | POA: Diagnosis not present

## 2023-02-19 ENCOUNTER — Ambulatory Visit (HOSPITAL_COMMUNITY)
Admission: RE | Admit: 2023-02-19 | Discharge: 2023-02-19 | Disposition: A | Discharge status: Home / Self Care | Payer: Medicare Other | Source: Ambulatory Visit | Attending: Internal Medicine | Admitting: Internal Medicine

## 2023-02-19 VITALS — BP 130/84 | HR 61 | Ht 68.0 in | Wt 206.6 lb

## 2023-02-19 DIAGNOSIS — Z5181 Encounter for therapeutic drug level monitoring: Secondary | ICD-10-CM | POA: Diagnosis not present

## 2023-02-19 DIAGNOSIS — Z7901 Long term (current) use of anticoagulants: Secondary | ICD-10-CM | POA: Insufficient documentation

## 2023-02-19 DIAGNOSIS — I491 Atrial premature depolarization: Secondary | ICD-10-CM | POA: Insufficient documentation

## 2023-02-19 DIAGNOSIS — Z79899 Other long term (current) drug therapy: Secondary | ICD-10-CM | POA: Diagnosis not present

## 2023-02-19 DIAGNOSIS — I4819 Other persistent atrial fibrillation: Secondary | ICD-10-CM | POA: Diagnosis not present

## 2023-02-19 LAB — BASIC METABOLIC PANEL
Anion gap: 7 (ref 5–15)
BUN: 16 mg/dL (ref 8–23)
CO2: 24 mmol/L (ref 22–32)
Calcium: 8.9 mg/dL (ref 8.9–10.3)
Chloride: 108 mmol/L (ref 98–111)
Creatinine, Ser: 1.08 mg/dL (ref 0.61–1.24)
GFR, Estimated: >60 mL/min (ref >60)
Glucose, Bld: 109 mg/dL — ABNORMAL HIGH (ref 70–99)
Potassium: 3.8 mmol/L (ref 3.5–5.1)
Sodium: 139 mmol/L (ref 135–145)

## 2023-02-19 LAB — MAGNESIUM: Magnesium: 2.3 mg/dL (ref 1.7–2.4)

## 2023-02-19 NOTE — Progress Notes (Signed)
Primary Care Physician: Soundra Pilon, FNP Referring Physician: Dr. Doyle Askew III is a 67 y.o. male with a h/o afib with CVR that saw Dr. Elberta Fortis in September and was scheduled for an ablation pending 07/11/21. He reports he continues to be highly asymptomatic and well functioning with his afib. He usually will play pickle ball in the am and then go to the gym in the afternoons. He does notice a slow heart beat at times,when at rest, non sustained in the 30's- 40's that will increase back to  normal range with activity.   His health is unchanged since he saw Dr. Elberta Fortis last and no recent illness. Weight is stable, he is not prone to retain fluid.   F/u in afib clinic, 08/12/22. He is s/p ablation 07/11/22. He feels he went back into afib on 1/25. He has not felt bad with afib and it is rate controlled. He had been going to the gym and carrying out usual activities.  No missed anticoagulation and I discussed pursing a cardioversion which he is in agreement.   F/u in afib clinic, 08/21/22. He had a successful cardioversion and remains in SR today.   F/u in Afib clinic, 11/14/22. He is back in Afib since 5/7. He noted this via Kardiamobile device. Some shortness of breath but overall he is typically rate controlled when in Afib. He has never needed to take his prn diltiazem when in Afib. He is interested in staying in rhythm and hesitant to do another cardioversion without something else keeping him in normal rhythm. No missed doses of Eliquis 5 mg BID.   F/u in Afib clinic, 01/06/23. Patient presents today for Tikosyn admission. He is currently in rate controlled Afib. He has stopped Zestoretic as of this past Friday. No benadryl. No missed doses of Eliquis. No new medications since pharmacist review.   F/u in Afib clinic, 01/16/23. Patient is s/p Tikosyn admission 7/9-12/24. He underwent successful cardioversion on 01/08/23. Discharged on Tikosyn 500 mcg BID. He feels well overall.  Intermittent afternoon headaches but not everyday. No missed doses of Eliquis.   F/u in Afib clinic, 02/19/23. Patient is here for 1 month Tikosyn surveillance. He feels well overall. He is in NSR today. He recently biked 70 miles in 2 days in [place] without issue. No episodes of Afib since last OV. No missed doses of Tikosyn or Eliquis.   Today, he denies symptoms of palpitations, chest pain, shortness of breath, orthopnea, PND, lower extremity edema, dizziness, presyncope, syncope, or neurologic sequela. The patient is tolerating medications without difficulties and is otherwise without complaint today.   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   Left Chest tx cx3 23fu   PONV (postoperative nausea and vomiting)    Past Surgical History:  Procedure Laterality Date   ACL implant     right knee    ATRIAL FIBRILLATION ABLATION N/A 07/11/2022   Procedure: ATRIAL FIBRILLATION ABLATION;  Surgeon: Regan Lemming, MD;  Location: [facility];  Service: Cardiovascular;  Laterality: N/A;  CARDIOVERSION N/A 08/21/2021   Procedure: CARDIOVERSION;  Surgeon: [name], MD;  Location: [facility] ENDOSCOPY;  Service: Cardiovascular;  Laterality: N/A;   CARDIOVERSION N/A 08/14/2022   Procedure: CARDIOVERSION;  Surgeon: Quintella Reichert, MD;  Location: [facility] ENDOSCOPY;  Service: Cardiovascular;  Laterality: N/A;   CARDIOVERSION N/A 01/08/2023   Procedure: CARDIOVERSION;  Surgeon: Quintella Reichert, MD;  Location: [facility] CV LAB;  Service: Cardiovascular;  Laterality: N/A;   colonscopy      Excision of melanoma on chest wall  2013   TOTAL KNEE ARTHROPLASTY Right  05/29/2014   Procedure: RIGHT TOTAL KNEE ARTHROPLASTY WITH REMOVAL OF HARDWARE;  Surgeon: Shelda Pal, MD;  Location: [facility] ORS;  Service: Orthopedics;  Laterality: Right;    Current Outpatient Medications  Medication Sig Dispense Refill   acetaminophen (TYLENOL) 500 MG tablet Take 500-1,000 mg by mouth every 6 (six) hours as needed (pain).     atorvastatin (LIPITOR) 10 MG tablet Take 10 mg by mouth every evening. Pt takes at 7 pm     diclofenac Sodium (VOLTAREN) 1 % GEL Apply 1 Application topically 2 (two) times daily as needed (knee pain).     diltiazem (CARDIZEM) 30 MG tablet Take 30 mg every 6 hours as needed for heart rate greater than 120 60 tablet 6   dofetilide (TIKOSYN) 500 MCG capsule Take 1 capsule (500 mcg total) by mouth 2 (two) times daily. 180 capsule 1   ELIQUIS 5 MG TABS tablet TAKE ONE TABLET BY MOUTH TWICE A DAY (Patient taking differently: Take 5 mg by mouth 2 (two) times daily. Pt takes at 7 am and 7 pm) 180 tablet 3   lisinopril (ZESTRIL) 20 MG tablet Take 1 tablet (20 mg total) by mouth daily. (Patient taking differently: Take 20 mg by mouth daily. Pt takes at 7 am) 30 tablet 3   tamsulosin (FLOMAX) 0.4 MG CAPS capsule Take 1 capsule (0.4 mg total) by mouth daily. (Patient taking differently: Take 0.4 mg by mouth daily. Pt takes at 7 am) 30 capsule 0   No current facility-administered medications for this encounter.    No Known Allergies  ROS- All systems are reviewed and negative except as per the HPI above  Physical Exam: Vitals:   02/19/23 1511  BP: 130/84  Pulse: 61  Weight: 93.7 kg  Height: 5\' 8"  (1.727 m)    Wt Readings from Last 3 Encounters:  02/19/23 93.7 kg  01/16/23 91.5 kg  01/06/23 92.4 kg   Labs: Lab Results  Component Value Date   NA 137 01/16/2023   K 3.9 01/16/2023   CL 107 01/16/2023   CO2 23 01/16/2023   GLUCOSE 98 01/16/2023   BUN 21 01/16/2023   CREATININE 1.16 01/16/2023   CALCIUM 9.1 01/16/2023   MG 2.2 01/16/2023   Lab  Results  Component Value Date   INR 1.02 05/22/2014   Lab Results  Component Value Date   CHOL  03/09/2008    169        ATP III CLASSIFICATION:  <200     mg/dL   Desirable  161-096  mg/dL   Borderline High  >=045    mg/dL   High   HDL 31 (L) 40/98/1191   LDLCALC (H) 03/09/2008    106        Total Cholesterol/HDL:CHD Risk Coronary Heart Disease Risk Table                     Men  Women  1/2 Average Risk   3.4   3.3   TRIG 159 (H) 03/09/2008   GEN- The patient is well appearing, alert and oriented x 3 today.   Neck - no JVD or carotid bruit noted Lungs- Clear to ausculation bilaterally, normal work of breathing Heart- Regular rate and rhythm; no murmurs, rubs, or gallops Extremities- no clubbing, cyanosis, or edema Skin - no rash or ecchymosis noted  EKG-   Vent. rate 61 BPM PR interval 250 ms QRS duration 72 ms QT/QTcB 440/442 ms P-R-T axes 34 239 18 Sinus rhythm with 1st degree A-V block with Premature atrial complexes Right superior axis deviation Septal infarct , age undetermined Inferior infarct , age undetermined Abnormal ECG When compared with ECG of 16-Jan-2023 14:07, PREVIOUS ECG IS PRESENT  Echo- 08/15/21 1. Left ventricular ejection fraction, by estimation, is 60 to 65%. Left  ventricular ejection fraction by 3D volume is 65 %. The left ventricle has  normal function. The left ventricle has no regional wall motion  abnormalities. There is mild asymmetric  left ventricular hypertrophy of the basal-septal segment. Left ventricular  diastolic function could not be evaluated.   2. Right ventricular systolic function is normal. The right ventricular  size is normal. There is normal pulmonary artery systolic pressure. The  estimated right ventricular systolic pressure is 25.7 mmHg.   3. Left atrial size was moderately dilated.   4. The mitral valve is grossly normal. Mild to moderate mitral valve  regurgitation.   5. The aortic valve is tricuspid. Aortic  valve regurgitation is not  visualized. No aortic stenosis is present.   6. Aortic dilatation noted. There is borderline dilatation of the aortic  root, measuring 38 mm.   7. The inferior vena cava is normal in size with greater than 50%  respiratory variability, suggesting right atrial pressure of 3 mmHg.   Comparison(s): No prior Echocardiogram.    Assessment and Plan:  1. Persistent afib S/p ablation 07/11/21 and returned to afib 07/24/22 Successful cardioversion 08/14/22 S/p Tikosyn admission 7/9-12/24.   He is currently in NSR. Qtc stable. Bmet and mag drawn today. Continue Tikosyn 500 mcg BID.   2. CHA2DS2VASc  score of 1 (age) Continue eliquis 5 mg bid - no missed doses.    F/u 3 months for Tikosyn surveillance.   Lake Bells, PA-C [facility] [address] [place], [place] [address] [phone]

## 2023-02-20 ENCOUNTER — Other Ambulatory Visit (HOSPITAL_COMMUNITY): Payer: Self-pay | Admitting: *Deleted

## 2023-02-20 MED ORDER — POTASSIUM CHLORIDE CRYS ER 20 MEQ PO TBCR: 20.0000 meq | EXTENDED_RELEASE_TABLET | Freq: Every day | ORAL | 3 refills | Status: DC

## 2023-03-13 ENCOUNTER — Ambulatory Visit (HOSPITAL_COMMUNITY)
Admission: RE | Admit: 2023-03-13 | Discharge: 2023-03-13 | Disposition: A | Discharge status: Home / Self Care | Payer: Medicare Other | Source: Ambulatory Visit | Attending: Internal Medicine | Admitting: Internal Medicine

## 2023-03-13 DIAGNOSIS — I4819 Other persistent atrial fibrillation: Secondary | ICD-10-CM | POA: Diagnosis not present

## 2023-03-13 LAB — BASIC METABOLIC PANEL
Anion gap: 8 (ref 5–15)
BUN: 16 mg/dL (ref 8–23)
CO2: 28 mmol/L (ref 22–32)
Calcium: 9.5 mg/dL (ref 8.9–10.3)
Chloride: 105 mmol/L (ref 98–111)
Creatinine, Ser: 1.59 mg/dL — ABNORMAL HIGH (ref 0.61–1.24)
GFR, Estimated: 47 mL/min — ABNORMAL LOW (ref >60)
Glucose, Bld: 75 mg/dL (ref 70–99)
Potassium: 4.3 mmol/L (ref 3.5–5.1)
Sodium: 141 mmol/L (ref 135–145)

## 2023-03-13 MED ORDER — LISINOPRIL 20 MG PO TABS: 20.0000 mg | ORAL_TABLET | Freq: Every day | ORAL | 1 refills | Status: DC

## 2023-03-20 DIAGNOSIS — E785 Hyperlipidemia, unspecified: Secondary | ICD-10-CM | POA: Diagnosis not present

## 2023-03-20 DIAGNOSIS — H60502 Unspecified acute noninfective otitis externa, left ear: Secondary | ICD-10-CM | POA: Diagnosis not present

## 2023-03-20 DIAGNOSIS — I10 Essential (primary) hypertension: Secondary | ICD-10-CM | POA: Diagnosis not present

## 2023-03-20 DIAGNOSIS — H938X2 Other specified disorders of left ear: Secondary | ICD-10-CM | POA: Diagnosis not present

## 2023-03-20 DIAGNOSIS — H6692 Otitis media, unspecified, left ear: Secondary | ICD-10-CM | POA: Diagnosis not present

## 2023-03-26 ENCOUNTER — Ambulatory Visit (HOSPITAL_COMMUNITY)
Admission: RE | Admit: 2023-03-26 | Discharge: 2023-03-26 | Disposition: A | Discharge status: Home / Self Care | Payer: Self-pay | Source: Ambulatory Visit | Attending: Internal Medicine | Admitting: Internal Medicine

## 2023-03-26 DIAGNOSIS — I4819 Other persistent atrial fibrillation: Secondary | ICD-10-CM | POA: Diagnosis not present

## 2023-03-26 LAB — BASIC METABOLIC PANEL
Anion gap: 7 (ref 5–15)
BUN: 17 mg/dL (ref 8–23)
CO2: 25 mmol/L (ref 22–32)
Calcium: 9.1 mg/dL (ref 8.9–10.3)
Chloride: 107 mmol/L (ref 98–111)
Creatinine, Ser: 1.3 mg/dL — ABNORMAL HIGH (ref 0.61–1.24)
GFR, Estimated: >60 mL/min (ref >60)
Glucose, Bld: 83 mg/dL (ref 70–99)
Potassium: 4.3 mmol/L (ref 3.5–5.1)
Sodium: 139 mmol/L (ref 135–145)

## 2023-05-06 DIAGNOSIS — K080 Exfoliation of teeth due to systemic causes: Secondary | ICD-10-CM | POA: Diagnosis not present

## 2023-05-21 ENCOUNTER — Ambulatory Visit (HOSPITAL_COMMUNITY): Payer: Medicare Other | Admitting: Internal Medicine

## 2023-05-27 ENCOUNTER — Ambulatory Visit (HOSPITAL_COMMUNITY)
Admission: RE | Admit: 2023-05-27 | Discharge: 2023-05-27 | Disposition: A | Discharge status: Home / Self Care | Payer: Medicare Other | Source: Ambulatory Visit | Attending: Internal Medicine | Admitting: Internal Medicine

## 2023-05-27 ENCOUNTER — Encounter (HOSPITAL_COMMUNITY): Payer: Self-pay | Admitting: Internal Medicine

## 2023-05-27 VITALS — BP 126/90 | HR 62 | Ht 68.0 in | Wt 207.0 lb

## 2023-05-27 DIAGNOSIS — I4819 Other persistent atrial fibrillation: Secondary | ICD-10-CM | POA: Diagnosis not present

## 2023-05-27 DIAGNOSIS — I440 Atrioventricular block, first degree: Secondary | ICD-10-CM | POA: Insufficient documentation

## 2023-05-27 DIAGNOSIS — Z79899 Other long term (current) drug therapy: Secondary | ICD-10-CM | POA: Insufficient documentation

## 2023-05-27 DIAGNOSIS — Z5181 Encounter for therapeutic drug level monitoring: Secondary | ICD-10-CM | POA: Diagnosis not present

## 2023-05-27 DIAGNOSIS — Z7901 Long term (current) use of anticoagulants: Secondary | ICD-10-CM | POA: Diagnosis not present

## 2023-05-27 DIAGNOSIS — I491 Atrial premature depolarization: Secondary | ICD-10-CM | POA: Insufficient documentation

## 2023-05-27 LAB — BASIC METABOLIC PANEL
Anion gap: 9 (ref 5–15)
BUN: 15 mg/dL (ref 8–23)
CO2: 22 mmol/L (ref 22–32)
Calcium: 9.2 mg/dL (ref 8.9–10.3)
Chloride: 109 mmol/L (ref 98–111)
Creatinine, Ser: 1.03 mg/dL (ref 0.61–1.24)
GFR, Estimated: >60 mL/min (ref >60)
Glucose, Bld: 87 mg/dL (ref 70–99)
Potassium: 3.9 mmol/L (ref 3.5–5.1)
Sodium: 140 mmol/L (ref 135–145)

## 2023-05-27 LAB — MAGNESIUM: Magnesium: 2.2 mg/dL (ref 1.7–2.4)

## 2023-05-27 NOTE — Progress Notes (Signed)
Primary Care Physician: Soundra Pilon, FNP Referring Physician: [name] is a 67 y.o. male with a h/o afib with CVR that saw Lawrence Compton in September and was scheduled for an ablation pending 07/11/21. He reports he continues to be highly asymptomatic and well functioning with his afib. He usually will play pickle ball in the am and then go to the gym in the afternoons. He does notice a slow heart beat at times,when at rest, non sustained in the 30's- 40's that will increase back to  normal range with activity.   His health is unchanged since he saw Lawrence Compton last and no recent illness. Weight is stable, he is not prone to retain fluid.   F/u in afib clinic, 08/12/22. He is s/p ablation 07/11/22. He feels he went back into afib on 1/25. He has not felt bad with afib and it is rate controlled. He had been going to the gym and carrying out usual activities.  No missed anticoagulation and I discussed pursing a cardioversion which he is in agreement.   F/u in afib clinic, 08/21/22. He had a successful cardioversion and remains in SR today.   F/u in Afib clinic, 11/14/22. He is back in Afib since 5/7. He noted this via [employer] device. Some shortness of breath but overall he is typically rate controlled when in Afib. He has never needed to take his prn diltiazem when in Afib. He is interested in staying in rhythm and hesitant to do another cardioversion without something else keeping him in normal rhythm. No missed doses of Eliquis 5 mg BID.   F/u in Afib clinic, 01/06/23. Patient presents today for Tikosyn admission. He is currently in rate controlled Afib. He has stopped Zestoretic as of this past Friday. No benadryl. No missed doses of Eliquis. No new medications since pharmacist review.   F/u in Afib clinic, 01/16/23. Patient is s/p Tikosyn admission 7/9-12/24. He underwent successful cardioversion on 01/08/23. Discharged on Tikosyn 500 mcg BID. He feels well overall.  Intermittent afternoon headaches but not everyday. No missed doses of Eliquis.   F/u in Afib clinic, 02/19/23. Patient is here for 1 month Tikosyn surveillance. He feels well overall. He is in NSR today. He recently biked 70 miles in 2 days in [place] without issue. No episodes of Afib since last OV. No missed doses of Tikosyn or Eliquis.   F/u in Afib clinic, 05/27/23. Patient is here for 3 months Tikosyn surveillance. He is currently in NSR. He feels well overall. He just traveled to [place] for 5 weeks and hiked extensively without issue. No episodes of Afib since last OV. No missed doses of Tikosyn 500 mcg or Eliquis.   Today, he denies symptoms of palpitations, chest pain, shortness of breath, orthopnea, PND, lower extremity edema, dizziness, presyncope, syncope, or neurologic sequela. The patient is tolerating medications without difficulties and is otherwise without complaint today.   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   Left Chest tx cx3 71fu   PONV (postoperative nausea and vomiting)  Past Surgical History:  Procedure Laterality Date   ACL implant     right knee    ATRIAL FIBRILLATION ABLATION N/A 07/11/2022   Procedure: ATRIAL FIBRILLATION ABLATION;  Surgeon: Lawrence Lemming, Lawrence Compton;  Location: [facility];  Service: Cardiovascular;  Laterality: N/A;   CARDIOVERSION N/A 08/21/2021   Procedure: CARDIOVERSION;  Surgeon: [name], Lawrence Compton;  Location: [facility] ENDOSCOPY;  Service: Cardiovascular;  Laterality: N/A;   CARDIOVERSION N/A 08/14/2022   Procedure: CARDIOVERSION;  Surgeon: Lawrence Reichert, Lawrence Compton;  Location: [facility] ENDOSCOPY;   Service: Cardiovascular;  Laterality: N/A;   CARDIOVERSION N/A 01/08/2023   Procedure: CARDIOVERSION;  Surgeon: Lawrence Reichert, Lawrence Compton;  Location: [facility];  Service: Cardiovascular;  Laterality: N/A;   colonscopy      Excision of melanoma on chest wall  2013   TOTAL KNEE ARTHROPLASTY Right 05/29/2014   Procedure: RIGHT TOTAL KNEE ARTHROPLASTY WITH REMOVAL OF HARDWARE;  Surgeon: Shelda Pal, Lawrence Compton;  Location: [facility] ORS;  Service: Orthopedics;  Laterality: Right;    Current Outpatient Medications  Medication Sig Dispense Refill   acetaminophen (TYLENOL) 500 MG tablet Take 500-1,000 mg by mouth every 6 (six) hours as needed (pain).     atorvastatin (LIPITOR) 10 MG tablet Take 10 mg by mouth every evening. Pt takes at 7 pm     diclofenac Sodium (VOLTAREN) 1 % GEL Apply 1 Application topically 2 (two) times daily as needed (knee pain).     diltiazem (CARDIZEM) 30 MG tablet Take 30 mg every 6 hours as needed for heart rate greater than 120 60 tablet 6   dofetilide (TIKOSYN) 500 MCG capsule Take 1 capsule (500 mcg total) by mouth 2 (two) times daily. 180 capsule 1   ELIQUIS 5 MG TABS tablet TAKE ONE TABLET BY MOUTH TWICE A DAY (Patient taking differently: Take 5 mg by mouth 2 (two) times daily. Pt takes at 7 am and 7 pm) 180 tablet 3   lisinopril (ZESTRIL) 20 MG tablet Take 1 tablet (20 mg total) by mouth daily. 90 tablet 1   potassium chloride SA (KLOR-CON M) 20 MEQ tablet Take 1 tablet (20 mEq total) by mouth daily. 90 tablet 3   tamsulosin (FLOMAX) 0.4 MG CAPS capsule Take 1 capsule (0.4 mg total) by mouth daily. (Patient taking differently: Take 0.4 mg by mouth daily. Pt takes at 7 am) 30 capsule 0   No current facility-administered medications for this visit.    No Known Allergies  ROS- All systems are reviewed and negative except as per the HPI above  Physical Exam: There were no vitals filed for this visit.   Wt Readings from Last 3 Encounters:  02/19/23 93.7 kg  01/16/23 91.5  kg  01/06/23 92.4 kg   Labs: Lab Results  Component Value Date   NA 139 03/26/2023   K 4.3 03/26/2023   CL 107 03/26/2023   CO2 25 03/26/2023   GLUCOSE 83 03/26/2023   BUN 17 03/26/2023   CREATININE 1.30 (H) 03/26/2023   CALCIUM 9.1 03/26/2023   MG 2.3 02/19/2023   Lab Results  Component Value Date   INR 1.02 05/22/2014   Lab Results  Component Value Date   CHOL  03/09/2008    169        ATP III CLASSIFICATION:  <200     mg/dL   Desirable  829-562  mg/dL   Borderline High  >=130    mg/dL   High   HDL 31 (L) 86/57/8469  LDLCALC (H) 03/09/2008    106        Total Cholesterol/HDL:CHD Risk Coronary Heart Disease Risk Table                     Men   Women  1/2 Average Risk   3.4   3.3   TRIG 159 (H) 03/09/2008   GEN- The patient is well appearing, alert and oriented x 3 today.   Neck - no JVD or carotid bruit noted Lungs- Clear to ausculation bilaterally, normal work of breathing Heart- Regular rate and rhythm, no murmurs, rubs or gallops, PMI not laterally displaced Extremities- no clubbing, cyanosis, or edema Skin - no rash or ecchymosis noted   EKG-   Vent. rate 62 BPM PR interval 258 ms QRS duration 72 ms QT/QTcB 398/403 ms P-R-T axes 62 144 45 Sinus rhythm with 1st degree A-V block with Premature supraventricular complexes Right axis deviation Septal infarct , age undetermined Abnormal ECG When compared with ECG of 19-Feb-2023 15:18, PREVIOUS ECG IS PRESENT  Echo- 08/15/21 1. Left ventricular ejection fraction, by estimation, is 60 to 65%. Left  ventricular ejection fraction by 3D volume is 65 %. The left ventricle has  normal function. The left ventricle has no regional wall motion  abnormalities. There is mild asymmetric  left ventricular hypertrophy of the basal-septal segment. Left ventricular  diastolic function could not be evaluated.   2. Right ventricular systolic function is normal. The right ventricular  size is normal. There is normal  pulmonary artery systolic pressure. The  estimated right ventricular systolic pressure is 25.7 mmHg.   3. Left atrial size was moderately dilated.   4. The mitral valve is grossly normal. Mild to moderate mitral valve  regurgitation.   5. The aortic valve is tricuspid. Aortic valve regurgitation is not  visualized. No aortic stenosis is present.   6. Aortic dilatation noted. There is borderline dilatation of the aortic  root, measuring 38 mm.   7. The inferior vena cava is normal in size with greater than 50%  respiratory variability, suggesting right atrial pressure of 3 mmHg.   Comparison(s): No prior Echocardiogram.    Assessment and Plan:  1. Persistent afib S/p ablation 07/11/21 and returned to afib 07/24/22 Successful cardioversion 08/14/22 S/p Tikosyn admission 7/9-12/24.   He is currently in NSR. Qtc stable. Bmet and mag drawn today. Continue Tikosyn 500 mcg BID.   2. CHA2DS2VASc  score of 1 (age) Continue eliquis 5 mg bid - no missed doses.    F/u 3 months for Tikosyn surveillance.   Lawrence Bells, PA-C Afib Clinic [facility] [address] [place], [place] [address] [phone]

## 2023-06-08 DIAGNOSIS — H524 Presbyopia: Secondary | ICD-10-CM | POA: Diagnosis not present

## 2023-06-08 DIAGNOSIS — H43813 Vitreous degeneration, bilateral: Secondary | ICD-10-CM | POA: Diagnosis not present

## 2023-06-17 ENCOUNTER — Encounter (HOSPITAL_COMMUNITY): Payer: Self-pay

## 2023-08-05 DIAGNOSIS — I4819 Other persistent atrial fibrillation: Secondary | ICD-10-CM | POA: Diagnosis not present

## 2023-08-05 DIAGNOSIS — E785 Hyperlipidemia, unspecified: Secondary | ICD-10-CM | POA: Diagnosis not present

## 2023-08-05 DIAGNOSIS — I77810 Thoracic aortic ectasia: Secondary | ICD-10-CM | POA: Diagnosis not present

## 2023-08-05 DIAGNOSIS — I10 Essential (primary) hypertension: Secondary | ICD-10-CM | POA: Diagnosis not present

## 2023-08-05 DIAGNOSIS — N400 Enlarged prostate without lower urinary tract symptoms: Secondary | ICD-10-CM | POA: Diagnosis not present

## 2023-08-05 DIAGNOSIS — Z23 Encounter for immunization: Secondary | ICD-10-CM | POA: Diagnosis not present

## 2023-08-05 DIAGNOSIS — D6869 Other thrombophilia: Secondary | ICD-10-CM | POA: Diagnosis not present

## 2023-08-05 DIAGNOSIS — Z0000 Encounter for general adult medical examination without abnormal findings: Secondary | ICD-10-CM | POA: Diagnosis not present

## 2023-08-06 ENCOUNTER — Other Ambulatory Visit (HOSPITAL_COMMUNITY): Payer: Self-pay

## 2023-08-06 MED ORDER — DOFETILIDE 500 MCG PO CAPS: 500.0000 ug | ORAL_CAPSULE | Freq: Two times a day (BID) | ORAL | 3 refills | Status: DC

## 2023-08-22 ENCOUNTER — Ambulatory Visit (HOSPITAL_COMMUNITY)
Admission: EM | Admit: 2023-08-22 | Discharge: 2023-08-22 | Disposition: A | Discharge status: Home / Self Care | Payer: Medicare Other | Attending: Internal Medicine | Admitting: Internal Medicine

## 2023-08-22 ENCOUNTER — Encounter (HOSPITAL_COMMUNITY): Payer: Self-pay | Admitting: *Deleted

## 2023-08-22 DIAGNOSIS — S46812A Strain of other muscles, fascia and tendons at shoulder and upper arm level, left arm, initial encounter: Secondary | ICD-10-CM

## 2023-08-22 MED ORDER — CYCLOBENZAPRINE HCL 10 MG PO TABS: 10.0000 mg | ORAL_TABLET | Freq: Two times a day (BID) | ORAL | 0 refills | Status: DC | PRN

## 2023-08-22 MED ORDER — DEXAMETHASONE SODIUM PHOSPHATE 10 MG/ML IJ SOLN
INTRAMUSCULAR | Status: AC
  Filled 2023-08-22: qty 1

## 2023-08-22 MED ORDER — DEXAMETHASONE SODIUM PHOSPHATE 10 MG/ML IJ SOLN
10.0000 mg | Freq: Once | INTRAMUSCULAR | Status: AC
  Administered 2023-08-22: 10 mg via INTRAMUSCULAR

## 2023-08-22 MED ORDER — TRAMADOL HCL 50 MG PO TABS: 50.0000 mg | ORAL_TABLET | Freq: Four times a day (QID) | ORAL | 0 refills | Status: DC | PRN

## 2023-08-22 NOTE — Discharge Instructions (Addendum)
 Left trapezius muscle strain with spasms. This can be treated with the following:  Decadron injection given today. This is a steroid to help with inflammation and pain. Flexeril 10 mg every 12 hours as needed for muscle spasms.  Use caution as this medication can cause drowsiness. Tramadol 50 mg every 6 hours as needed for severe pain.  May use heat to help relieve the symptoms. Do this for 10-15 minutes 2-3 times daily.  Continue light stretching.  Return to urgent care or PCP if symptoms worsen or fail to resolve.

## 2023-08-22 NOTE — ED Triage Notes (Signed)
 Pt states he slept wrong about a week ago and he is having upper back pain between his neck and shoulders. He had some robaxin at home left over which was helping but he ran out. He states worse with movement.   Pt states he is on Tikosyn and eliquis so he can't take NSAIDS

## 2023-08-22 NOTE — ED Provider Notes (Signed)
 [facility]    CSN: [id number] Arrival date & time: 08/22/23  1459      History   Chief Complaint Chief Complaint  Patient presents with   Back Pain    HPI Lawrence Compton is a 68 y.o. male.   68 year old male who presents urgent care with complaints of left neck, left shoulder and left upper back spasm, pain and decreased range of motion.  He reports that he slept wrong about a week ago.  He woke up with a lot of stiffness and it progressively got worse.  He reports that he can feel a knot on the upper part of his back.  He can massage this night which does release the symptoms temporarily but then they come back.  He has been stretching and using heat regularly but it is not helping.  He did have some Robaxin at home that he tried to use which helped temporarily but he ran out this morning.  He denies any injury to the area.   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   Left Chest tx cx3 4fu   PONV (postoperative nausea and vomiting)     Patient Active Problem List   Diagnosis Date Noted   Encounter for monitoring dofetilide therapy 01/16/2023   Persistent atrial fibrillation (HCC) 08/14/2022   Paroxysmal atrial fibrillation (HCC)    Obese 05/30/2014   S/P right TKA 05/29/2014    Past Surgical History:  Procedure Laterality Date   ACL implant     right knee    ATRIAL FIBRILLATION ABLATION N/A 07/11/2022   Procedure: ATRIAL FIBRILLATION ABLATION;  Surgeon: Regan Lemming, MD;  Location: [facility];  Service:  Cardiovascular;  Laterality: N/A;   CARDIOVERSION N/A 08/21/2021   Procedure: CARDIOVERSION;  Surgeon: [name], MD;  Location: [facility] ENDOSCOPY;  Service: Cardiovascular;  Laterality: N/A;   CARDIOVERSION N/A 08/14/2022   Procedure: CARDIOVERSION;  Surgeon: Quintella Reichert, MD;  Location: [facility] ENDOSCOPY;  Service: Cardiovascular;  Laterality: N/A;   CARDIOVERSION N/A 01/08/2023   Procedure: CARDIOVERSION;  Surgeon: Quintella Reichert, MD;  Location: [facility];  Service: Cardiovascular;  Laterality: N/A;   colonscopy      Excision of melanoma on chest wall  2013   TOTAL KNEE ARTHROPLASTY Right 05/29/2014   Procedure: RIGHT TOTAL KNEE ARTHROPLASTY WITH REMOVAL OF HARDWARE;  Surgeon: Shelda Pal, MD;  Location: [facility]S;  Service: Orthopedics;  Laterality: Right;       Home Medications    Prior to Admission medications   Medication Sig Start Date End Date Taking? Authorizing Provider  atorvastatin (LIPITOR) 10 MG tablet Take 10 mg by mouth every evening. Pt takes at 7 pm 08/06/22  Yes [provider]  cyclobenzaprine (FLEXERIL) 10 MG tablet Take 1 tablet (10 mg total) by mouth 2 (two) times daily as needed for muscle spasms. 08/22/23  Yes Shawnta Schlegel A, PA-C  diclofenac Sodium (VOLTAREN) 1 %  GEL Apply 1 Application topically 2 (two) times daily as needed (knee pain).   Yes [provider]  diltiazem (CARDIZEM) 30 MG tablet Take 30 mg every 6 hours as needed for heart rate greater than 120 08/01/21  Yes Branch, Alben Spittle, MD  dofetilide (TIKOSYN) 500 MCG capsule Take 1 capsule (500 mcg total) by mouth 2 (two) times daily. 08/06/23  Yes Eustace Pen, PA-C  ELIQUIS 5 MG TABS tablet TAKE ONE TABLET BY MOUTH TWICE A DAY Patient taking differently: Take 5 mg by mouth 2 (two) times daily. Pt takes at 7 am and 7 pm 08/13/22  Yes Branch, Alben Spittle, MD  lisinopril (ZESTRIL) 20 MG tablet Take 1 tablet (20 mg total) by mouth daily. 03/13/23 03/12/24 Yes Eustace Pen, PA-C   potassium chloride SA (KLOR-CON M) 20 MEQ tablet Take 1 tablet (20 mEq total) by mouth daily. 02/20/23  Yes Eustace Pen, PA-C  tamsulosin (FLOMAX) 0.4 MG CAPS capsule Take 1 capsule (0.4 mg total) by mouth daily. Patient taking differently: Take 0.4 mg by mouth daily. Pt takes at 7 am 10/10/17  Yes Tilden Fossa, MD  traMADol (ULTRAM) 50 MG tablet Take 1 tablet (50 mg total) by mouth every 6 (six) hours as needed for severe pain (pain score 7-10). 08/22/23  Yes Reha Martinovich A, PA-C  acetaminophen (TYLENOL) 500 MG tablet Take 500-1,000 mg by mouth every 6 (six) hours as needed (pain).    [provider]    Family History Family History  Problem Relation Age of Onset   Hypertension Mother    Atrial fibrillation Mother    Hypertension Father     Social History Social History   Tobacco Use   Smoking status: Never   Smokeless tobacco: Never   Tobacco comments:    Never smoked 05/27/23  Vaping Use   Vaping status: Never Used  Substance Use Topics   Alcohol use: Yes    Comment: occas    Drug use: No     Allergies   Patient has no known allergies.   Review of Systems Review of Systems  Constitutional:  Negative for chills and fever.  HENT:  Negative for ear pain and sore throat.   Eyes:  Negative for pain and visual disturbance.  Respiratory:  Negative for cough and shortness of breath.   Cardiovascular:  Negative for chest pain and palpitations.  Gastrointestinal:  Negative for abdominal pain and vomiting.  Genitourinary:  Negative for dysuria and hematuria.  Musculoskeletal:  Positive for back pain, neck pain and neck stiffness. Negative for arthralgias.  Skin:  Negative for color change and rash.  Neurological:  Negative for seizures and syncope.  All other systems reviewed and are negative.    Physical Exam Triage Vital Signs ED Triage Vitals  Encounter Vitals Group     BP 08/22/23 1607 (!) 144/88     Systolic BP Percentile --      Diastolic BP  Percentile --      Pulse Rate 08/22/23 1607 64     Resp 08/22/23 1607 18     Temp 08/22/23 1607 98.1 F (36.7 C)     Temp Source 08/22/23 1607 Oral     SpO2 08/22/23 1607 97 %     Weight --      Height --      Head Circumference --      Peak Flow --      Pain Score 08/22/23 1605 9     Pain  Loc --      Pain Education --      Exclude from Growth Chart --    No data found.  Updated Vital Signs BP (!) 144/88 (BP Location: Left Arm)   Pulse 64   Temp 98.1 F (36.7 C) (Oral)   Resp 18   SpO2 97%   Visual Acuity Right Eye Distance:   Left Eye Distance:   Bilateral Distance:    Right Eye Near:   Left Eye Near:    Bilateral Near:     Physical Exam Vitals and nursing note reviewed.  Constitutional:      General: He is not in acute distress.    Appearance: He is well-developed.  HENT:     Head: Normocephalic and atraumatic.  Eyes:     Conjunctiva/sclera: Conjunctivae normal.  Cardiovascular:     Rate and Rhythm: Normal rate and regular rhythm.     Heart sounds: No murmur heard. Pulmonary:     Effort: Pulmonary effort is normal. No respiratory distress.     Breath sounds: Normal breath sounds.  Abdominal:     Palpations: Abdomen is soft.     Tenderness: There is no abdominal tenderness.  Musculoskeletal:        General: No swelling.     Cervical back: Neck supple.       Back:  Skin:    General: Skin is warm and dry.     Capillary Refill: Capillary refill takes less than 2 seconds.  Neurological:     General: No focal deficit present.     Mental Status: He is alert.  Psychiatric:        Mood and Affect: Mood normal.      UC Treatments / Results  Labs (all labs ordered are listed, but only abnormal results are displayed) Labs Reviewed - No data to display  EKG   Radiology No results found.  Procedures Procedures (including critical care time)  Medications Ordered in UC Medications  dexamethasone (DECADRON) injection 10 mg (10 mg Intramuscular  Given 08/22/23 1633)    Initial Impression / Assessment and Plan / UC Course  I have reviewed the triage vital signs and the nursing notes.  Pertinent labs & imaging results that were available during my care of the patient were reviewed by me and considered in my medical decision making (see chart for details).     Strain of left trapezius muscle, initial encounter   Left trapezius muscle strain with spasms. This can be treated with the following:  Decadron injection given today. This is a steroid to help with inflammation and pain. Flexeril 10 mg every 12 hours as needed for muscle spasms.  Use caution as this medication can cause drowsiness. Tramadol 50 mg every 6 hours as needed for severe pain.  May use heat to help relieve the symptoms. Do this for 10-15 minutes 2-3 times daily.  Continue light stretching.  Return to urgent care or PCP if symptoms worsen or fail to resolve.    Final Clinical Impressions(s) / UC Diagnoses   Final diagnoses:  Strain of left trapezius muscle, initial encounter     Discharge Instructions      Left trapezius muscle strain with spasms. This can be treated with the following:  Decadron injection given today. This is a steroid to help with inflammation and pain. Flexeril 10 mg every 12 hours as needed for muscle spasms.  Use caution as this medication can cause drowsiness. Tramadol 50 mg every 6 hours  as needed for severe pain.  May use heat to help relieve the symptoms. Do this for 10-15 minutes 2-3 times daily.  Continue light stretching.  Return to urgent care or PCP if symptoms worsen or fail to resolve.     ED Prescriptions     Medication Sig Dispense Auth. Provider   cyclobenzaprine (FLEXERIL) 10 MG tablet Take 1 tablet (10 mg total) by mouth 2 (two) times daily as needed for muscle spasms. 20 tablet Dea Bitting A, PA-C   traMADol (ULTRAM) 50 MG tablet Take 1 tablet (50 mg total) by mouth every 6 (six) hours as needed for severe  pain (pain score 7-10). 15 tablet Landis Martins, [place]      I have reviewed the PDMP during this encounter.   Landis Martins, [place] 08/22/23 1640

## 2023-08-26 ENCOUNTER — Ambulatory Visit (HOSPITAL_COMMUNITY)
Admission: RE | Admit: 2023-08-26 | Discharge: 2023-08-26 | Disposition: A | Discharge status: Home / Self Care | Payer: Medicare Other | Source: Ambulatory Visit | Attending: Internal Medicine | Admitting: Internal Medicine

## 2023-08-26 VITALS — BP 128/80 | HR 66 | Ht 68.0 in | Wt 213.8 lb

## 2023-08-26 DIAGNOSIS — I4819 Other persistent atrial fibrillation: Secondary | ICD-10-CM

## 2023-08-26 DIAGNOSIS — Z79899 Other long term (current) drug therapy: Secondary | ICD-10-CM

## 2023-08-26 DIAGNOSIS — Z7901 Long term (current) use of anticoagulants: Secondary | ICD-10-CM | POA: Insufficient documentation

## 2023-08-26 DIAGNOSIS — Z5181 Encounter for therapeutic drug level monitoring: Secondary | ICD-10-CM

## 2023-08-26 DIAGNOSIS — I4891 Unspecified atrial fibrillation: Secondary | ICD-10-CM | POA: Diagnosis not present

## 2023-08-26 DIAGNOSIS — D6869 Other thrombophilia: Secondary | ICD-10-CM

## 2023-08-26 LAB — BASIC METABOLIC PANEL
Anion gap: 9 (ref 5–15)
BUN: 17 mg/dL (ref 8–23)
CO2: 25 mmol/L (ref 22–32)
Calcium: 9.2 mg/dL (ref 8.9–10.3)
Chloride: 105 mmol/L (ref 98–111)
Creatinine, Ser: 1.09 mg/dL (ref 0.61–1.24)
GFR, Estimated: >60 mL/min (ref >60)
Glucose, Bld: 101 mg/dL — ABNORMAL HIGH (ref 70–99)
Potassium: 4.3 mmol/L (ref 3.5–5.1)
Sodium: 139 mmol/L (ref 135–145)

## 2023-08-26 LAB — MAGNESIUM: Magnesium: 2.3 mg/dL (ref 1.7–2.4)

## 2023-08-26 NOTE — Progress Notes (Signed)
 Primary Care Physician: Soundra Pilon, FNP Referring Physician: Dr. Doyle Askew III is a 68 y.o. male with a h/o afib with CVR that saw Dr. Elberta Fortis in September and was scheduled for an ablation pending 07/11/21. He reports he continues to be highly asymptomatic and well functioning with his afib. He usually will play pickle ball in the am and then go to the gym in the afternoons. He does notice a slow heart beat at times,when at rest, non sustained in the 30's- 40's that will increase back to  normal range with activity.   His health is unchanged since he saw Dr. Elberta Fortis last and no recent illness. Weight is stable, he is not prone to retain fluid.   F/u in afib clinic, 08/12/22. He is s/p ablation 07/11/22. He feels he went back into afib on 1/25. He has not felt bad with afib and it is rate controlled. He had been going to the gym and carrying out usual activities.  No missed anticoagulation and I discussed pursing a cardioversion which he is in agreement.   F/u in afib clinic, 08/21/22. He had a successful cardioversion and remains in SR today.   F/u in Afib clinic, 11/14/22. He is back in Afib since 5/7. He noted this via Kardiamobile device. Some shortness of breath but overall he is typically rate controlled when in Afib. He has never needed to take his prn diltiazem when in Afib. He is interested in staying in rhythm and hesitant to do another cardioversion without something else keeping him in normal rhythm. No missed doses of Eliquis 5 mg BID.   F/u in Afib clinic, 01/06/23. Patient presents today for Tikosyn admission. He is currently in rate controlled Afib. He has stopped Zestoretic as of this past Friday. No benadryl. No missed doses of Eliquis. No new medications since pharmacist review.   F/u in Afib clinic, 01/16/23. Patient is s/p Tikosyn admission 7/9-12/24. He underwent successful cardioversion on 01/08/23. Discharged on Tikosyn 500 mcg BID. He feels well overall.  Intermittent afternoon headaches but not everyday. No missed doses of Eliquis.   F/u in Afib clinic, 02/19/23. Patient is here for 1 month Tikosyn surveillance. He feels well overall. He is in NSR today. He recently biked 70 miles in 2 days in [place] without issue. No episodes of Afib since last OV. No missed doses of Tikosyn or Eliquis.   F/u in Afib clinic, 05/27/23. Patient is here for 3 months Tikosyn surveillance. He is currently in NSR. He feels well overall. He just traveled to [place] for 5 weeks and hiked extensively without issue. No episodes of Afib since last OV. No missed doses of Tikosyn 500 mcg or Eliquis.   F/u in Afib clinic, 08/26/23. Patient is here for Tikosyn surveillance. He is currently in NSR. He has had no episodes of Afib since last office visit. He continues to be very physically active. No missed doses of Tikosyn or Eliquis.   Today, he denies symptoms of palpitations, chest pain, shortness of breath, orthopnea, PND, lower extremity edema, dizziness, presyncope, syncope, or neurologic sequela. The patient is tolerating medications without difficulties and is otherwise without complaint today.   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   Left Chest tx cx3 51fu   PONV (postoperative nausea and vomiting)    Past Surgical History:  Procedure Laterality Date   ACL implant     right knee    ATRIAL FIBRILLATION ABLATION N/A 07/11/2022   Procedure: ATRIAL FIBRILLATION ABLATION;  Surgeon: Regan Lemming, MD;  [facility];  Service: Cardiovascular;  Laterality: N/A;   CARDIOVERSION N/A 08/21/2021   Procedure:  CARDIOVERSION;  Surgeon: [name], MD;  Location: [facility] ENDOSCOPY;  Service: Cardiovascular;  Laterality: N/A;   CARDIOVERSION N/A 08/14/2022   Procedure: CARDIOVERSION;  Surgeon: Quintella Reichert, MD;  Location: [facility] ENDOSCOPY;  Service: Cardiovascular;  Laterality: N/A;   CARDIOVERSION N/A 01/08/2023   Procedure: CARDIOVERSION;  Surgeon: Quintella Reichert, MD;  Location: [facility] IN[facility] CV LAB;  Service: Cardiovascular;  Laterality: N/A;   colonscopy      Excision of melanoma on chest wall  2013   TOTAL KNEE ARTHROPLASTY Right 05/29/2014   Procedure: RIGHT TOTAL KNEE ARTHROPLASTY WITH REMOVAL OF HARDWARE;  Surgeon: Shelda Pal, MD;  Location: [facility] ORS;  Service: Orthopedics;  Laterality: Right;    Current Outpatient Medications  Medication Sig Dispense Refill   acetaminophen (TYLENOL) 500 MG tablet Take 500-1,000 mg by mouth as needed (pain).     atorvastatin (LIPITOR) 10 MG tablet Take 10 mg by mouth every evening. Pt takes at 7 pm     cyclobenzaprine (FLEXERIL) 10 MG tablet Take 1 tablet (10 mg total) by mouth 2 (two) times daily as needed for muscle spasms. 20 tablet 0   diclofenac Sodium (VOLTAREN) 1 % GEL Apply 1 Application topically 2 (two) times daily as needed (knee pain).     diltiazem (CARDIZEM) 30 MG tablet Take 30 mg every 6 hours as needed for heart rate greater than 120 60 tablet 6   dofetilide (TIKOSYN) 500 MCG capsule Take 1 capsule (500 mcg total) by mouth 2 (two) times daily. 180 capsule 3   ELIQUIS 5 MG TABS tablet TAKE ONE TABLET BY MOUTH TWICE A DAY (Patient taking differently: Take 5 mg by mouth 2 (two) times daily. Pt takes at 7 am and 7 pm) 180 tablet 3   lisinopril (ZESTRIL) 20 MG tablet Take 1 tablet (20 mg total) by mouth daily. 90 tablet 1   potassium chloride SA (KLOR-CON M) 20 MEQ tablet Take 1 tablet (20 mEq total) by mouth daily. 90 tablet 3   tamsulosin (FLOMAX) 0.4 MG CAPS capsule Take 1 capsule (0.4 mg total) by mouth daily. (Patient taking differently: Take 0.4  mg by mouth daily. Pt takes at 7 am) 30 capsule 0   traMADol (ULTRAM) 50 MG tablet Take 1 tablet (50 mg total) by mouth every 6 (six) hours as needed for severe pain (pain score 7-10). 15 tablet 0   No current facility-administered medications for this encounter.    No Known Allergies  ROS- All systems are reviewed and negative except as per the HPI above  Physical Exam: Vitals:   08/26/23 1531  BP: 128/80  Pulse: 66  Weight: 97 kg  Height: 5\' 8"  (1.727 m)     Wt Readings from Last 3 Encounters:  08/26/23 97 kg  05/27/23 93.9 kg  02/19/23 93.7 kg   Labs: Lab Results  Component Value Date   NA 140 05/27/2023  K 3.9 05/27/2023   CL 109 05/27/2023   CO2 22 05/27/2023   GLUCOSE 87 05/27/2023   BUN 15 05/27/2023   CREATININE 1.03 05/27/2023   CALCIUM 9.2 05/27/2023   MG 2.2 05/27/2023   Lab Results  Component Value Date   INR 1.02 05/22/2014   Lab Results  Component Value Date   CHOL  03/09/2008    169        ATP III CLASSIFICATION:  <200     mg/dL   Desirable  696-295  mg/dL   Borderline High  >=284    mg/dL   High   HDL 31 (L) 13/24/4010   LDLCALC (H) 03/09/2008    106        Total Cholesterol/HDL:CHD Risk Coronary Heart Disease Risk Table                     Men   Women  1/2 Average Risk   3.4   3.3   TRIG 159 (H) 03/09/2008   GEN- The patient is well appearing, alert and oriented x 3 today.   Neck - no JVD or carotid bruit noted Lungs- Clear to ausculation bilaterally, normal work of breathing Heart- Regular rate and rhythm, no murmurs, rubs or gallops, PMI not laterally displaced Extremities- no clubbing, cyanosis, or edema Skin - no rash or ecchymosis noted   EKG-   Vent. rate 66 BPM PR interval 240 ms QRS duration 70 ms QT/QTcB 400/419 ms P-R-T axes 17 165 12 Sinus rhythm with 1st degree A-V block Septal infarct , age undetermined Lateral infarct , age undetermined Abnormal ECG When compared with ECG of 27-May-2023 14:26, PREVIOUS ECG  IS PRESENT  Echo- 08/15/21 1. Left ventricular ejection fraction, by estimation, is 60 to 65%. Left  ventricular ejection fraction by 3D volume is 65 %. The left ventricle has  normal function. The left ventricle has no regional wall motion  abnormalities. There is mild asymmetric  left ventricular hypertrophy of the basal-septal segment. Left ventricular  diastolic function could not be evaluated.   2. Right ventricular systolic function is normal. The right ventricular  size is normal. There is normal pulmonary artery systolic pressure. The  estimated right ventricular systolic pressure is 25.7 mmHg.   3. Left atrial size was moderately dilated.   4. The mitral valve is grossly normal. Mild to moderate mitral valve  regurgitation.   5. The aortic valve is tricuspid. Aortic valve regurgitation is not  visualized. No aortic stenosis is present.   6. Aortic dilatation noted. There is borderline dilatation of the aortic  root, measuring 38 mm.   7. The inferior vena cava is normal in size with greater than 50%  respiratory variability, suggesting right atrial pressure of 3 mmHg.   Comparison(s): No prior Echocardiogram.    Assessment and Plan:  1. Persistent afib S/p ablation 07/11/21 and returned to afib 07/24/22 Successful cardioversion 08/14/22. S/p Tikosyn admission 7/9-12/24.   He is currently in NSR.  High risk medication monitoring (ICD10: [id number]) Patient requires ongoing monitoring for anti-arrhythmic medication which has the potential to cause life threatening arrhythmias or AV block. Qtc stable. Continue Tikosyn 500 mcg BID. Bmet and mag drawn today.   2. CHA2DS2VASc score of 1 (age) Continue Eliquis 5 mg BID. Patient wishes to continue this.      F/u 3 months for Tikosyn surveillance.   Lake Bells, PA-C Afib Clinic [facility] [address] [place], [place] [address] [phone]

## 2023-08-27 DIAGNOSIS — L570 Actinic keratosis: Secondary | ICD-10-CM | POA: Diagnosis not present

## 2023-08-27 DIAGNOSIS — D2262 Melanocytic nevi of left upper limb, including shoulder: Secondary | ICD-10-CM | POA: Diagnosis not present

## 2023-08-27 DIAGNOSIS — D225 Melanocytic nevi of trunk: Secondary | ICD-10-CM | POA: Diagnosis not present

## 2023-08-27 DIAGNOSIS — L821 Other seborrheic keratosis: Secondary | ICD-10-CM | POA: Diagnosis not present

## 2023-08-27 DIAGNOSIS — D2261 Melanocytic nevi of right upper limb, including shoulder: Secondary | ICD-10-CM | POA: Diagnosis not present

## 2023-08-27 DIAGNOSIS — D0421 Carcinoma in situ of skin of right ear and external auricular canal: Secondary | ICD-10-CM | POA: Diagnosis not present

## 2023-08-27 DIAGNOSIS — D485 Neoplasm of uncertain behavior of skin: Secondary | ICD-10-CM | POA: Diagnosis not present

## 2023-08-31 DIAGNOSIS — Z125 Encounter for screening for malignant neoplasm of prostate: Secondary | ICD-10-CM | POA: Diagnosis not present

## 2023-09-03 DIAGNOSIS — H524 Presbyopia: Secondary | ICD-10-CM | POA: Diagnosis not present

## 2023-09-07 DIAGNOSIS — N486 Induration penis plastica: Secondary | ICD-10-CM | POA: Diagnosis not present

## 2023-09-07 DIAGNOSIS — Z125 Encounter for screening for malignant neoplasm of prostate: Secondary | ICD-10-CM | POA: Diagnosis not present

## 2023-09-07 DIAGNOSIS — R040 Epistaxis: Secondary | ICD-10-CM | POA: Diagnosis not present

## 2023-09-07 DIAGNOSIS — R351 Nocturia: Secondary | ICD-10-CM | POA: Diagnosis not present

## 2023-09-08 DIAGNOSIS — R040 Epistaxis: Secondary | ICD-10-CM | POA: Diagnosis not present

## 2023-09-10 ENCOUNTER — Ambulatory Visit (INDEPENDENT_AMBULATORY_CARE_PROVIDER_SITE_OTHER): Admitting: Otolaryngology

## 2023-09-10 ENCOUNTER — Encounter (INDEPENDENT_AMBULATORY_CARE_PROVIDER_SITE_OTHER): Payer: Self-pay

## 2023-09-10 VITALS — BP 145/87 | HR 60 | Ht 68.0 in | Wt 204.0 lb

## 2023-09-10 DIAGNOSIS — R040 Epistaxis: Secondary | ICD-10-CM

## 2023-09-11 DIAGNOSIS — R040 Epistaxis: Secondary | ICD-10-CM | POA: Insufficient documentation

## 2023-09-11 NOTE — Progress Notes (Signed)
 Patient ID: Lawrence Compton, male   DOB: 08-Oct-1955, 68 y.o.   MRN: [id number]  CC: Recurrent left epistaxis  HPI:  Lawrence Compton is a 68 y.o. male who presents today complaining of recurrent left-sided epistaxis.  He has had 3 episodes of severe left-sided bleeding over the past week.  He is currently on Eliquis.  He has a history of atrial fibrillation.  The patient denies any recent nasal or facial trauma.  He has no previous ENT surgery.  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   Left Chest tx cx3 93fu   PONV (postoperative nausea and vomiting)     Past Surgical History:  Procedure Laterality Date   ACL implant     right knee    ATRIAL FIBRILLATION ABLATION N/A 07/11/2022   Procedure: ATRIAL FIBRILLATION ABLATION;  Surgeon: Regan Lemming, MD;  Location: [facility];  Service: Cardiovascular;  Laterality: N/A;   CARDIOVERSION N/A 08/21/2021   Procedure: CARDIOVERSION;  Surgeon: [name], MD;  Location: [facility] ENDOSCOPY;  Service: Cardiovascular;  Laterality: N/A;   CARDIOVERSION N/A 08/14/2022   Procedure: CARDIOVERSION;  Surgeon: Quintella Reichert, MD;  Location: [facility] ENDOSCOPY;  Service: Cardiovascular;  Laterality: N/A;   CARDIOVERSION N/A 01/08/2023   Procedure: CARDIOVERSION;  Surgeon: Quintella Reichert, MD;  Location: [facility] INVAS[facility] CV LAB;  Service: Cardiovascular;  Laterality: N/A;   colonscopy      Excision of melanoma on chest wall  2013   TOTAL KNEE ARTHROPLASTY Right 05/29/2014   Procedure: RIGHT TOTAL KNEE ARTHROPLASTY WITH REMOVAL OF HARDWARE;  Surgeon: Shelda Pal, MD;  Location: [facility] ORS;  Service: Orthopedics;   Laterality: Right;    Family History  Problem Relation Age of Onset   Hypertension Mother    Atrial fibrillation Mother    Hypertension Father     Social History:  reports that he has never smoked. He has never used smokeless tobacco. He reports current alcohol use. He reports that he does not use drugs.  Allergies: No Known Allergies  Prior to Admission medications   Medication Sig Start Date End Date Taking? Authorizing Provider  acetaminophen (TYLENOL) 500 MG tablet Take 500-1,000 mg by mouth as needed (pain).   Yes [provider]  atorvastatin (LIPITOR) 10 MG tablet Take 10 mg by mouth every evening. Pt takes at 7 pm 08/06/22  Yes [provider]  cyclobenzaprine (FLEXERIL) 10 MG tablet Take 1 tablet (10 mg total) by mouth 2 (two) times daily as needed for muscle spasms. 08/22/23  Yes White, Elizabeth A, PA-C  diclofenac Sodium (VOLTAREN) 1 % GEL Apply 1 Application topically 2 (two) times daily as needed (knee pain).   Yes [provider]  diltiazem (CARDIZEM) 30 MG tablet Take 30 mg every 6 hours as needed for heart rate greater than 120 08/01/21  Yes Branch, Alben Spittle, MD  dofetilide (TIKOSYN) 500 MCG capsule Take 1 capsule (500 mcg total) by mouth 2 (two) times daily. 08/06/23  Yes Eustace Pen, PA-C  ELIQUIS 5 MG TABS tablet TAKE ONE  TABLET BY MOUTH TWICE A DAY Patient taking differently: Take 5 mg by mouth 2 (two) times daily. Pt takes at 7 am and 7 pm 08/13/22  Yes Branch, Alben Spittle, MD  lisinopril (ZESTRIL) 20 MG tablet Take 1 tablet (20 mg total) by mouth daily. 03/13/23 03/12/24 Yes Eustace Pen, PA-C  potassium chloride SA (KLOR-CON M) 20 MEQ tablet Take 1 tablet (20 mEq total) by mouth daily. 02/20/23  Yes Eustace Pen, PA-C  tamsulosin (FLOMAX) 0.4 MG CAPS capsule Take 1 capsule (0.4 mg total) by mouth daily. Patient taking differently: Take 0.4 mg by mouth daily. Pt takes at 7 am 10/10/17  Yes Tilden Fossa, MD  traMADol (ULTRAM) 50 MG tablet Take  1 tablet (50 mg total) by mouth every 6 (six) hours as needed for severe pain (pain score 7-10). 08/22/23  Yes White, Elizabeth A, PA-C    Blood pressure (!) 145/87, pulse 60, height 5\' 8"  (1.727 m), weight 204 lb (92.5 kg), SpO2 98%. Exam: General: Communicates without difficulty, well nourished, no acute distress. Head: Normocephalic, no evidence injury, no tenderness, facial buttresses intact without stepoff. Face/sinus: No tenderness to palpation and percussion. Facial movement is normal and symmetric. Eyes: PERRL, EOMI. No scleral icterus, conjunctivae clear. Neuro: CN II exam reveals vision grossly intact.  No nystagmus at any point of gaze. Ears: Auricles well formed without lesions.  Ear canals are intact without mass or lesion.  No erythema or edema is appreciated.  The TMs are intact without fluid. Nose: External evaluation reveals normal support and skin without lesions.  Dorsum is intact.  Anterior rhinoscopy reveals hypervascular areas on the left nasal septum.  Oral:  Oral cavity and oropharynx are intact, symmetric, without erythema or edema.  Mucosa is moist without lesions. Neck: Full range of motion without pain.  There is no significant lymphadenopathy.  No masses palpable.  Thyroid bed within normal limits to palpation.  Parotid glands and submandibular glands equal bilaterally without mass.  Trachea is midline. Neuro:  CN 2-12 grossly intact.   Procedure:  Endoscopic control of recurrent left epistaxis. Indication:  Recurrent epistaxis  Description:  The left nasal cavity is sprayed with topical xylocaine and neo-synephrine.  After adequate anesthesia is achieved, the nasal cavity is examined with a 0 rigid endoscope.  A suction catheter is inserted into parallel with the 0 endoscope, and it is used to suction blood clots from the nasal cavity.  Several hypervascular areas are noted on the anterior and superior portion of the septum. Active bleeding is noted. A silver nitrate stick is  inserted in parallel with the 0 endoscope.  It is used to repeatedly cauterized the hypervascular areas.  Good hemostasis is achieved.  The patient tolerated the procedure well.    Assessment: 1.  Recurrent left epistaxis.  Hypervascular areas are noted on the left anterior and superior nasal septum. 3.  No suspicious mass or lesion is noted today.  Plan: 1.  The physical exam and nasal endoscopy findings are reviewed with the patient. 2.  Endoscopic cauterization of the left nasal septum. 3.  Humidifier and nasal ointment during the winter months. 4.  The patient will return for reevaluation in 1 month.  Eben Choinski W Talonda Artist 09/11/2023, 8:56 AM

## 2023-09-22 DIAGNOSIS — M7542 Impingement syndrome of left shoulder: Secondary | ICD-10-CM | POA: Diagnosis not present

## 2023-09-28 ENCOUNTER — Other Ambulatory Visit (HOSPITAL_COMMUNITY): Payer: Self-pay | Admitting: *Deleted

## 2023-09-28 MED ORDER — LISINOPRIL 20 MG PO TABS: 20.0000 mg | ORAL_TABLET | Freq: Every day | ORAL | 1 refills | Status: DC

## 2023-10-06 ENCOUNTER — Ambulatory Visit (INDEPENDENT_AMBULATORY_CARE_PROVIDER_SITE_OTHER)

## 2023-11-02 ENCOUNTER — Other Ambulatory Visit: Payer: Self-pay | Admitting: Internal Medicine

## 2023-11-02 NOTE — Telephone Encounter (Signed)
 Prescription refill request for Eliquis  received. Indication: Afib  Last office visit:08/26/23 Virgil Griffiths)  Scr: 1.09 (08/26/23)  Age: 68 Weight: 92.5kg  Appropriate dose. Refill sent.

## 2023-11-10 DIAGNOSIS — K080 Exfoliation of teeth due to systemic causes: Secondary | ICD-10-CM | POA: Diagnosis not present

## 2023-11-17 ENCOUNTER — Ambulatory Visit (HOSPITAL_COMMUNITY)
Admission: RE | Admit: 2023-11-17 | Discharge: 2023-11-17 | Disposition: A | Discharge status: Home / Self Care | Payer: Medicare Other | Source: Ambulatory Visit | Attending: Internal Medicine | Admitting: Internal Medicine

## 2023-11-17 VITALS — BP 132/74 | HR 69 | Ht 68.0 in | Wt 206.0 lb

## 2023-11-17 DIAGNOSIS — D6869 Other thrombophilia: Secondary | ICD-10-CM

## 2023-11-17 DIAGNOSIS — I4819 Other persistent atrial fibrillation: Secondary | ICD-10-CM

## 2023-11-17 DIAGNOSIS — I4891 Unspecified atrial fibrillation: Secondary | ICD-10-CM | POA: Diagnosis not present

## 2023-11-17 DIAGNOSIS — Z5181 Encounter for therapeutic drug level monitoring: Secondary | ICD-10-CM

## 2023-11-17 DIAGNOSIS — Z79899 Other long term (current) drug therapy: Secondary | ICD-10-CM

## 2023-11-17 NOTE — Progress Notes (Signed)
 Primary Care Physician: Alejandro Hurt, FNP Referring Physician: Dr. Esequiel Hector III is a 68 y.o. male with a h/o afib with CVR that saw Dr. Lawana Pray in September and s/p ablation 07/11/22. He reports he continues to be highly asymptomatic and well functioning with his afib. He usually will play pickle ball in the am and then go to the gym in the afternoons. He does notice a slow heart beat at times,when at rest, non sustained in the 30's- 40's that will increase back to normal range with activity. Patient is s/p Tikosyn  admission 7/9-05/2023. He underwent successful cardioversion on 01/08/23. Discharged on Tikosyn  500 mcg BID.   Follow up 11/17/23 for Tikosyn  surveillance. He is currently in NSR. He has had possibly 3 episodes of Afib since last office visit. He has noted them on Kardiamobile device. He does not have cardiac awareness. No missed doses of Tikosyn  500 mcg BID. He had recurrent epistaxis and cauterization by ENT in March; no further epistaxis episodes. He is on Eliquis  5 mg BID.   Today, he denies symptoms of palpitations, chest pain, shortness of breath, orthopnea, PND, lower extremity edema, dizziness, presyncope, syncope, or neurologic sequela. The patient is tolerating medications without difficulties and is otherwise without complaint today.   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   Left Chest tx cx3 64fu   PONV (postoperative nausea and vomiting)    Past Surgical History:  Procedure Laterality Date   ACL implant     right knee    ATRIAL FIBRILLATION ABLATION N/A 07/11/2022   Procedure: ATRIAL FIBRILLATION ABLATION;   Surgeon: Lei Pump, MD;  [facility]: [facility] INVAS[facility] CV LAB;  Service: Cardiovascular;  Laterality: N/A;   CARDIOVERSION N/A 08/21/2021   Procedure: CARDIOVERSION;  Surgeon: Alroy Aspen Lela Purple, MD;  Location[facility] ENDOSCOPY;  Service: Cardiovascular;  Laterality: N/A;   CARDIOVERSION N/A 08/14/2022   Procedure: CARDIOVERSION;  Surgeon: Jacqueline Matsu, MD;  Location: [facility] ENDOSCOPY;  Service: Cardiovascular;  Laterality: N/A;   CARDIOVERSION N/A 01/08/2023   Procedure: CARDIOVERSION;  Surgeon: Jacqueline Matsu, MD;  Location: [facility] INVAS[facility] CV LAB;  Service: Cardiovascular;  Laterality: N/A;   colonscopy      Excision of melanoma on chest wall  2013   TOTAL KNEE ARTHROPLASTY Right 05/29/2014   Procedure: RIGHT TOTAL KNEE ARTHROPLASTY WITH REMOVAL OF HARDWARE;  Surgeon: Bevin Bucks, MD;  Location: [facility] ORS;  Service: Orthopedics;  Laterality: Right;    Current Outpatient Medications  Medication Sig Dispense Refill   acetaminophen  (TYLENOL ) 500 MG tablet Take 500-1,000 mg by mouth as needed (pain).     apixaban  (ELIQUIS ) 5 MG TABS tablet TAKE 1 TABLET BY MOUTH 2 TIMES A DAY 180 tablet 1   atorvastatin  (LIPITOR) 10 MG tablet Take 10 mg by mouth every evening. Pt takes at 7 pm     diclofenac Sodium (VOLTAREN) 1 % GEL Apply 1 Application topically 2 (two) times daily as needed (knee pain).     diltiazem  (CARDIZEM ) 30 MG tablet Take 30 mg every 6 hours as  needed for heart rate greater than 120 60 tablet 6   dofetilide  (TIKOSYN ) 500 MCG capsule Take 1 capsule (500 mcg total) by mouth 2 (two) times daily. 180 capsule 3   lisinopril  (ZESTRIL ) 20 MG tablet Take 1 tablet (20 mg total) by mouth daily. 90 tablet 1   potassium chloride  SA (KLOR-CON  M) 20 MEQ tablet Take 1 tablet (20 mEq total) by mouth daily. 90 tablet 3   tamsulosin  (FLOMAX ) 0.4 MG CAPS capsule Take 1 capsule (0.4 mg total) by mouth daily. (Patient taking differently: Take 0.4 mg by mouth daily. Pt takes at 7 am) 30 capsule 0   No current  facility-administered medications for this encounter.    No Known Allergies  ROS- All systems are reviewed and negative except as per the HPI above  Physical Exam: Vitals:   11/17/23 1520  BP: 132/74  Pulse: 69  Weight: 93.4 kg  Height: 5\' 8"  (1.727 m)    Wt Readings from Last 3 Encounters:  11/17/23 93.4 kg  09/10/23 92.5 kg  08/26/23 97 kg   Labs: Lab Results  Component Value Date   NA 139 08/26/2023   K 4.3 08/26/2023   CL 105 08/26/2023   CO2 25 08/26/2023   GLUCOSE 101 (H) 08/26/2023   BUN 17 08/26/2023   CREATININE 1.09 08/26/2023   CALCIUM  9.2 08/26/2023   MG 2.3 08/26/2023    GEN- The patient is well appearing, alert and oriented x 3 today.   Neck - no JVD or carotid bruit noted Lungs- Clear to ausculation bilaterally, normal work of breathing Heart- Regular rate and rhythm, no murmurs, rubs or gallops, PMI not laterally displaced Extremities- no clubbing, cyanosis, or edema Skin - no rash or ecchymosis noted   EKG-   Vent. rate 69 BPM PR interval 242 ms QRS duration 70 ms QT/QTcB 404/432 ms P-R-T axes 12 166 15 Sinus rhythm with sinus arrhythmia with 1st degree A-V block Left posterior fascicular block Abnormal ECG When compared with ECG of 26-Aug-2023 15:39, Left posterior fascicular block is now Present  Echo- 08/15/21 1. Left ventricular ejection fraction, by estimation, is 60 to 65%. Left  ventricular ejection fraction by 3D volume is 65 %. The left ventricle has  normal function. The left ventricle has no regional wall motion  abnormalities. There is mild asymmetric  left ventricular hypertrophy of the basal-septal segment. Left ventricular  diastolic function could not be evaluated.   2. Right ventricular systolic function is normal. The right ventricular  size is normal. There is normal pulmonary artery systolic pressure. The  estimated right ventricular systolic pressure is 25.7 mmHg.   3. Left atrial size was moderately dilated.   4.  The mitral valve is grossly normal. Mild to moderate mitral valve  regurgitation.   5. The aortic valve is tricuspid. Aortic valve regurgitation is not  visualized. No aortic stenosis is present.   6. Aortic dilatation noted. There is borderline dilatation of the aortic  root, measuring 38 mm.   7. The inferior vena cava is normal in size with greater than 50%  respiratory variability, suggesting right atrial pressure of 3 mmHg.   Comparison(s): No prior Echocardiogram.    Assessment and Plan:  1. Persistent afib S/p ablation 07/11/22 Successful cardioversion 08/21/21, 08/14/22, 01/08/23 S/p Tikosyn  admission 7/9-12/24.   He is currently in NSR. Recommend to use Kardiamobile device more frequently to assess burden. He can consider magnesium  supplementation for cramps he has with increase in cycling recently.  High risk  medication monitoring (ICD10: [id number]) Patient requires ongoing monitoring for anti-arrhythmic medication which has the potential to cause life threatening arrhythmias or AV block. Qtc stable. Continue Tikosyn  500 mcg BID. Bmet and mag drawn today.  2. CHA2DS2VASc score of 1 (age) Continue Eliquis  5 mg BID. Patient prefers to remain on OAC.   F/u 3 months for Tikosyn  surveillance.   Minnie Amber, PA-C Afib Clinic [facility] [address] [place], [place] [address] [phone]

## 2023-11-18 ENCOUNTER — Ambulatory Visit (HOSPITAL_COMMUNITY): Payer: Self-pay | Admitting: Internal Medicine

## 2023-11-18 LAB — BASIC METABOLIC PANEL WITH GFR
BUN/Creatinine Ratio: 15 (ref 10–24)
BUN: 17 mg/dL (ref 8–27)
CO2: 21 mmol/L (ref 20–29)
Calcium: 10 mg/dL (ref 8.6–10.2)
Chloride: 104 mmol/L (ref 96–106)
Creatinine, Ser: 1.17 mg/dL (ref 0.76–1.27)
Glucose: 114 mg/dL — ABNORMAL HIGH (ref 70–99)
Potassium: 4.2 mmol/L (ref 3.5–5.2)
Sodium: 140 mmol/L (ref 134–144)
eGFR: 68 mL/min/{1.73_m2} (ref >59)

## 2023-11-18 LAB — MAGNESIUM: Magnesium: 2.4 mg/dL — ABNORMAL HIGH (ref 1.6–2.3)

## 2023-12-21 DIAGNOSIS — K080 Exfoliation of teeth due to systemic causes: Secondary | ICD-10-CM | POA: Diagnosis not present

## 2024-01-12 DIAGNOSIS — K080 Exfoliation of teeth due to systemic causes: Secondary | ICD-10-CM | POA: Diagnosis not present

## 2024-02-02 ENCOUNTER — Other Ambulatory Visit (HOSPITAL_COMMUNITY): Payer: Self-pay | Admitting: Internal Medicine

## 2024-02-17 ENCOUNTER — Encounter (HOSPITAL_COMMUNITY): Payer: Self-pay | Admitting: Internal Medicine

## 2024-02-17 ENCOUNTER — Ambulatory Visit (HOSPITAL_COMMUNITY)
Admission: RE | Admit: 2024-02-17 | Discharge: 2024-02-17 | Disposition: A | Discharge status: Home / Self Care | Source: Ambulatory Visit | Attending: Internal Medicine | Admitting: Internal Medicine

## 2024-02-17 VITALS — BP 110/82 | HR 64 | Ht 68.0 in | Wt 207.4 lb

## 2024-02-17 DIAGNOSIS — Z79899 Other long term (current) drug therapy: Secondary | ICD-10-CM

## 2024-02-17 DIAGNOSIS — Z5181 Encounter for therapeutic drug level monitoring: Secondary | ICD-10-CM | POA: Diagnosis not present

## 2024-02-17 DIAGNOSIS — D6869 Other thrombophilia: Secondary | ICD-10-CM

## 2024-02-17 DIAGNOSIS — I4819 Other persistent atrial fibrillation: Secondary | ICD-10-CM

## 2024-02-17 NOTE — Progress Notes (Signed)
 Primary Care Physician: Marvene Prentice SAUNDERS, FNP Referring Physician: Dr. Inocencio Renata LITTIE Lawrence Compton is a 68 y.o. male with a h/o afib with CVR that saw Dr. Inocencio in September and s/p ablation 07/11/22. He reports he continues to be highly asymptomatic and well functioning with his afib. He usually will play pickle ball in the am and then go to the gym in the afternoons. He does notice a slow heart beat at times,when at rest, non sustained in the 30's- 40's that will increase back to normal range with activity. Patient is s/p Tikosyn  admission 7/9-05/2023. He underwent successful cardioversion on 01/08/23. Discharged on Tikosyn  500 mcg BID.   Follow up 11/17/23 for Tikosyn  surveillance. He is currently in NSR. He has had possibly 3 episodes of Afib since last office visit. He has noted them on Kardiamobile device. He does not have cardiac awareness. No missed doses of Tikosyn  500 mcg BID. He had recurrent epistaxis and cauterization by ENT in March; no further epistaxis episodes. He is on Eliquis  5 mg BID.   On follow up 02/17/24, patient is here for Tikosyn  surveillance. He is currently in NSR. He has had very low Afib burden since last office visit. He checks regularly via Kardiamobile and possibly had one brief episode of Afib on 7/2. He recently went on a bicycle tour through countryside of [place] and [place]. No bleeding issues on Eliquis  5 mg BID.   Today, he denies symptoms of palpitations, chest pain, shortness of breath, orthopnea, PND, lower extremity edema, dizziness, presyncope, syncope, or neurologic sequela. The patient is tolerating medications without difficulties and is otherwise without complaint today.   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   Left Chest tx cx3 81fu   PONV (postoperative nausea and vomiting)    Past Surgical History:  Procedure Laterality Date   ACL implant     right knee    ATRIAL FIBRILLATION ABLATION N/A 07/11/2022   Procedure: ATRIAL FIBRILLATION ABLATION;  Surgeon: Inocencio Soyla Lunger, MD;  Location: [facility] INVASIVE CV LAB;  Service: Cardiovascular;  Laterality: N/A;   CARDIOVERSION N/A 08/21/2021   Procedure: CARDIOVERSION;  Surgeon: Alveta Aleene PARAS, MD;  Location: [facility] ENDOSCOPY;  Service: Cardiovascular;  Laterality: N/A;   CARDIOVERSION N/A 08/14/2022   Procedure: CARDIOVERSION;  Surgeon: Shlomo Wilbert SAUNDERS, MD;  Location: [facility] ENDOSCOPY;  Service: Cardiovascular;  Laterality: N/A;   CARDIOVERSION N/A 01/08/2023   Procedure: CARDIOVERSION;  Surgeon: Shlomo Wilbert SAUNDERS, MD;  Location: [facility] INVASIVE CV LAB;  Service: Cardiovascular;  Laterality: N/A;   colonscopy      Excision of melanoma on chest wall  2013   TOTAL KNEE ARTHROPLASTY Right 05/29/2014   Procedure: RIGHT TOTAL KNEE ARTHROPLASTY WITH REMOVAL OF HARDWARE;  Surgeon: Donnice JONETTA Car, MD;  Location: [facility] ORS;  Service: Orthopedics;  Laterality: Right;    Current Outpatient Medications  Medication Sig Dispense Refill   acetaminophen  (TYLENOL ) 500 MG tablet Take 500-1,000 mg by mouth as needed (pain).     apixaban  (ELIQUIS ) 5 MG TABS tablet TAKE 1 TABLET BY MOUTH  2 TIMES A DAY 180 tablet 1   atorvastatin  (LIPITOR) 10 MG tablet Take 10 mg by mouth every evening. Pt takes at 7 pm     diclofenac Sodium (VOLTAREN) 1 % GEL Apply 1 Application topically 2 (two) times daily as needed (knee pain).     diltiazem  (CARDIZEM ) 30 MG tablet Take 30 mg every 6 hours as needed for heart rate greater than 120 60 tablet 6   dofetilide  (TIKOSYN ) 500 MCG capsule Take 1 capsule (500 mcg total) by mouth 2 (two) times daily. 180 capsule 3   lisinopril  (ZESTRIL ) 20 MG tablet Take 1  tablet (20 mg total) by mouth daily. 90 tablet 1   potassium chloride  SA (KLOR-CON  M) 20 MEQ tablet TAKE ONE TABLET BY MOUTH DAILY 90 tablet 3   tamsulosin  (FLOMAX ) 0.4 MG CAPS capsule Take 1 capsule (0.4 mg total) by mouth daily. (Patient taking differently: Take 0.4 mg by mouth daily. Pt takes at 7 am) 30 capsule 0   No current facility-administered medications for this encounter.    No Known Allergies  ROS- All systems are reviewed and negative except as per the HPI above  Physical Exam: Vitals:   02/17/24 1526  BP: 110/82  Pulse: 64  Weight: 94.1 kg  Height: 5' 8 (1.727 m)     Wt Readings from Last 3 Encounters:  02/17/24 94.1 kg  11/17/23 93.4 kg  09/10/23 92.5 kg   Labs: Lab Results  Component Value Date   NA 140 11/17/2023   K 4.2 11/17/2023   CL 104 11/17/2023   CO2 21 11/17/2023   GLUCOSE 114 (H) 11/17/2023   BUN 17 11/17/2023   CREATININE 1.17 11/17/2023   CALCIUM  10.0 11/17/2023   MG 2.4 (H) 11/17/2023   GEN- The patient is well appearing, alert and oriented x 3 today.   Neck - no JVD or carotid bruit noted Lungs- Clear to ausculation bilaterally, normal work of breathing Heart- Regular rate and rhythm, no murmurs, rubs or gallops, PMI not laterally displaced Extremities- no clubbing, cyanosis, or edema Skin - no rash or ecchymosis noted   EKG-   Vent. rate 64 BPM PR interval 266 ms QRS duration 70 ms QT/QTcB 416/429 ms P-R-T axes -13 144 0 Sinus rhythm with 1st degree A-V block with Premature atrial complexes Left posterior fascicular block Abnormal ECG When compared with ECG of 17-Nov-2023 15:33, Premature atrial complexes are now Present  Echo- 08/15/21 1. Left ventricular ejection fraction, by estimation, is 60 to 65%. Left  ventricular ejection fraction by 3D volume is 65 %. The left ventricle has  normal function. The left ventricle has no regional wall motion  abnormalities. There is mild asymmetric  left ventricular hypertrophy of the  basal-septal segment. Left ventricular  diastolic function could not be evaluated.   2. Right ventricular systolic function is normal. The right ventricular  size is normal. There is normal pulmonary artery systolic pressure. The  estimated right ventricular systolic pressure is 25.7 mmHg.   3. Left atrial size was moderately dilated.   4. The mitral valve is grossly normal. Mild to moderate mitral valve  regurgitation.   5. The aortic valve is tricuspid. Aortic valve regurgitation is not  visualized. No aortic stenosis is present.   6. Aortic dilatation noted. There is borderline dilatation of the aortic  root, measuring 38 mm.   7. The inferior vena cava is normal in size with greater than 50%  respiratory variability, suggesting right atrial pressure of 3 mmHg.  Comparison(s): No prior Echocardiogram.    Assessment and Plan:  1. Persistent afib S/p ablation 07/11/22 Successful cardioversion 08/21/21, 08/14/22, 01/08/23 S/p Tikosyn  admission 7/9-12/24.   He is currently in NSR. He is happy with current management and appears to be maintaining normal rhythm on Tikosyn . We will continue regimen without change at this time.   High risk medication monitoring (ICD10: J342684) Patient requires ongoing monitoring for anti-arrhythmic medication which has the potential to cause life threatening arrhythmias or AV block. Qtc stable. Continue Tikosyn  500 mcg BID. Bmet and mag drawn today.    2. CHA2DS2VASc score of 1 (age) Continue Eliquis  5 mg BID per patient preference.    Follow up 6 months for Tikosyn  surveillance.    Fairy Heinrich, PA-C [facility] [address] [place], [place] [address] [phone]

## 2024-02-18 ENCOUNTER — Ambulatory Visit (HOSPITAL_COMMUNITY): Payer: Self-pay | Admitting: Internal Medicine

## 2024-02-18 LAB — MAGNESIUM: Magnesium: 2.3 mg/dL (ref 1.6–2.3)

## 2024-02-18 LAB — BASIC METABOLIC PANEL WITH GFR
BUN/Creatinine Ratio: 18 (ref 10–24)
BUN: 21 mg/dL (ref 8–27)
CO2: 23 mmol/L (ref 20–29)
Calcium: 9.7 mg/dL (ref 8.6–10.2)
Chloride: 107 mmol/L — ABNORMAL HIGH (ref 96–106)
Creatinine, Ser: 1.17 mg/dL (ref 0.76–1.27)
Glucose: 82 mg/dL (ref 70–99)
Potassium: 4.5 mmol/L (ref 3.5–5.2)
Sodium: 142 mmol/L (ref 134–144)
eGFR: 68 mL/min/1.73 (ref >59)

## 2024-02-22 DIAGNOSIS — K080 Exfoliation of teeth due to systemic causes: Secondary | ICD-10-CM | POA: Diagnosis not present

## 2024-03-02 DIAGNOSIS — I10 Essential (primary) hypertension: Secondary | ICD-10-CM | POA: Diagnosis not present

## 2024-03-02 DIAGNOSIS — I4819 Other persistent atrial fibrillation: Secondary | ICD-10-CM | POA: Diagnosis not present

## 2024-03-02 DIAGNOSIS — E785 Hyperlipidemia, unspecified: Secondary | ICD-10-CM | POA: Diagnosis not present

## 2024-03-24 ENCOUNTER — Other Ambulatory Visit: Payer: Self-pay

## 2024-03-24 ENCOUNTER — Encounter (HOSPITAL_COMMUNITY): Payer: Self-pay | Admitting: Emergency Medicine

## 2024-03-24 ENCOUNTER — Ambulatory Visit (HOSPITAL_COMMUNITY): Admission: EM | Admit: 2024-03-24 | Discharge: 2024-03-24 | Disposition: A | Discharge status: Home / Self Care

## 2024-03-24 DIAGNOSIS — R238 Other skin changes: Secondary | ICD-10-CM

## 2024-03-24 MED ORDER — TRIAMCINOLONE ACETONIDE 0.1 % EX CREA: 1.0000 | TOPICAL_CREAM | Freq: Two times a day (BID) | CUTANEOUS | 0 refills | Status: AC

## 2024-03-24 NOTE — ED Provider Notes (Signed)
 PCP: Marvene Prentice SAUNDERS, FNP Chief Complaint: Insect Bite  Subjective:   HPI: Patient is a 68 y.o. male here for skin irritation.  Patient states around 5 days ago on his posterior right upper arm he had a sensitivity and thought that maybe a spider had bit him.  For the next 5 days, this sensitivity to his skin has not changed.  He denies seeing any spider, seeing any redness or swelling.  He is unaware of any rashes on the area although he cannot see at all that well.  He denies hitting the area, or injuring this part of his arm.  There is no visible lesion.  He is able to palpate the area without tenderness but just states that the the area feels sensitive.  He has a trip that he is leaving on in a couple weeks and wants to make sure that he does not have any type of infection.  Patient has had his shingles vaccination.  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   Current Outpatient Medications on File Prior to Encounter  Medication Sig Dispense Refill   acetaminophen  (TYLENOL ) 500 MG tablet Take 500-1,000 mg by mouth as needed (pain).     apixaban  (ELIQUIS ) 5 MG TABS tablet TAKE 1 TABLET BY MOUTH 2 TIMES A DAY 180 tablet 1   atorvastatin  (LIPITOR) 10 MG tablet Take 10 mg by mouth every evening. Pt takes at 7 pm     diclofenac Sodium (VOLTAREN) 1 % GEL Apply 1 Application topically 2 (two) times daily as needed (knee pain).     diltiazem  (CARDIZEM ) 30 MG tablet Take 30 mg every 6 hours as needed for heart rate  greater than 120 60 tablet 6   dofetilide  (TIKOSYN ) 500 MCG capsule Take 1 capsule (500 mcg total) by mouth 2 (two) times daily. 180 capsule 3   lisinopril  (ZESTRIL ) 20 MG tablet Take 1 tablet (20 mg total) by mouth daily. 90 tablet 1   potassium chloride  SA (KLOR-CON  M) 20 MEQ tablet TAKE ONE TABLET BY MOUTH DAILY 90 tablet 3   tamsulosin  (FLOMAX ) 0.4 MG CAPS capsule Take 1 capsule (0.4 mg total) by mouth daily. (Patient taking differently: Take 0.4 mg by mouth daily. Pt takes at 7 am) 30 capsule 0    BP (!) 165/85 (BP Location: Right Arm)   Pulse 63   Temp 98 F (36.7 C) (Oral)   Resp 16   SpO2 95%        Objective:   Gen: Well developed, well nourished male in no acute distress. HEENT: Pupils equal, round, and reactive to light.  Conjunctiva non-injected.  Nares patent without discharge.  Oral mucosa is moist and pink.   Lungs: Normal respiratory effort MSK: Joints and muscles are symmetrical with no swelling, redness, or deformity.  Patient is able to move his upper extremities without difficulty. Skin: Posterior right arm is free from any lesion, ulcer, erythema,  edema or warmth.  There is no rash present.  He does have an area of actinic keratosis that he states that his dermatologist had seen this before and was not concerned, he has no tenderness to palpation over the area.  There is no palpable masses, no masses palpated below the skin. Ext: No cyanosis, clubbing, or edema.  Assessment/Plan:   Lawrence Compton is a 68 y.o. male who was seen today for the following: 1. Skin irritation (Primary) -Overall, patient does not have any alarm symptoms or infection, or shingles outbreak. - There is no redness, rash, pustule, ulcer, breakdown of skin - We will try and send in a topical steroid cream twice daily for 7 days although I feel that this may not really change anything for him - Reassured him that he does not have an infection or shingles outbreak at this time - Took a  picture of the area and instructed him to take pictures daily to see if there is change - If there is new redness, rash, irritation that presents itself, he can follow-up  - Provided education about cellulitis and shingles outbreak  Follow-up/Education:   May return sooner as needed and encouraged to call/e-mail for additional questions or  worsening symptoms in the interim.  Krystal Lowing, DO Sports Medicine Fellow 03/24/2024 10:27 AM  Disclaimer: This transcription was electronically signed. It was transcribed by [place] and may contain errors in the text that were not recognized on proofreading.     Lowing Krystal HERO, DO 03/24/24 1028

## 2024-03-24 NOTE — ED Triage Notes (Signed)
 Pt states he believes he had a spider bite on his right upper arm for the past few days, states are is very sensitive to touch, no redness or swollen noticed  during assessment.

## 2024-03-25 ENCOUNTER — Other Ambulatory Visit (HOSPITAL_COMMUNITY): Payer: Self-pay | Admitting: Internal Medicine

## 2024-04-14 DIAGNOSIS — L820 Inflamed seborrheic keratosis: Secondary | ICD-10-CM | POA: Diagnosis not present

## 2024-04-29 ENCOUNTER — Other Ambulatory Visit: Payer: Self-pay | Admitting: Cardiology

## 2024-04-29 MED ORDER — APIXABAN 5 MG PO TABS: 5.0000 mg | ORAL_TABLET | Freq: Two times a day (BID) | ORAL | 1 refills | Status: AC

## 2024-04-29 NOTE — Telephone Encounter (Signed)
 Prescription refill request for Eliquis  received. Indication:afib Last office visit:8/25 Scr:1.17  8/25 Age: 68 Weight:94.1  kg  Prescription refilled

## 2024-05-18 DIAGNOSIS — K080 Exfoliation of teeth due to systemic causes: Secondary | ICD-10-CM | POA: Diagnosis not present

## 2024-06-07 DIAGNOSIS — H5213 Myopia, bilateral: Secondary | ICD-10-CM | POA: Diagnosis not present

## 2024-07-25 ENCOUNTER — Other Ambulatory Visit (HOSPITAL_COMMUNITY): Payer: Self-pay | Admitting: Internal Medicine

## 2024-08-22 ENCOUNTER — Ambulatory Visit (HOSPITAL_COMMUNITY): Admitting: Internal Medicine
